# Patient Record
Sex: Female | Born: 1985 | Race: White | Hispanic: No | Marital: Married | Smoking: Never smoker
Health system: Southern US, Community
[De-identification: ages and names within clinical notes are randomized; demographics above are authoritative.]

## PROBLEM LIST (undated history)

## (undated) ENCOUNTER — Inpatient Hospital Stay (HOSPITAL_COMMUNITY): Payer: Self-pay

## (undated) DIAGNOSIS — Z8489 Family history of other specified conditions: Secondary | ICD-10-CM

## (undated) DIAGNOSIS — Z789 Other specified health status: Secondary | ICD-10-CM

## (undated) DIAGNOSIS — M549 Dorsalgia, unspecified: Secondary | ICD-10-CM

## (undated) DIAGNOSIS — M25559 Pain in unspecified hip: Secondary | ICD-10-CM

## (undated) HISTORY — DX: Dorsalgia, unspecified: M54.9

## (undated) HISTORY — PX: NO PAST SURGERIES: SHX2092

## (undated) HISTORY — DX: Family history of other specified conditions: Z84.89

## (undated) HISTORY — DX: Pain in unspecified hip: M25.559

---
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
## 2010-03-24 ENCOUNTER — Ambulatory Visit (HOSPITAL_COMMUNITY): Admission: RE | Admit: 2010-03-24 | Discharge: 2010-03-24 | Payer: Self-pay | Admitting: Obstetrics and Gynecology

## 2010-04-17 ENCOUNTER — Inpatient Hospital Stay (HOSPITAL_COMMUNITY): Admission: AD | Admit: 2010-04-17 | Discharge: 2010-04-17 | Payer: Self-pay | Admitting: Obstetrics & Gynecology

## 2010-07-25 ENCOUNTER — Ambulatory Visit: Payer: Self-pay | Admitting: Family

## 2010-07-25 ENCOUNTER — Inpatient Hospital Stay (HOSPITAL_COMMUNITY): Admission: AD | Admit: 2010-07-25 | Discharge: 2010-07-25 | Payer: Self-pay | Admitting: Obstetrics and Gynecology

## 2010-07-29 ENCOUNTER — Inpatient Hospital Stay (HOSPITAL_COMMUNITY): Admission: AD | Admit: 2010-07-29 | Discharge: 2010-07-29 | Payer: Self-pay | Admitting: Obstetrics and Gynecology

## 2010-08-01 ENCOUNTER — Inpatient Hospital Stay (HOSPITAL_COMMUNITY): Admission: AD | Admit: 2010-08-01 | Discharge: 2010-08-01 | Payer: Self-pay | Admitting: Obstetrics & Gynecology

## 2010-08-05 ENCOUNTER — Inpatient Hospital Stay (HOSPITAL_COMMUNITY): Admission: AD | Admit: 2010-08-05 | Discharge: 2010-08-08 | Payer: Self-pay | Admitting: Obstetrics and Gynecology

## 2010-08-05 ENCOUNTER — Ambulatory Visit: Payer: Self-pay | Admitting: Nurse Practitioner

## 2011-02-10 LAB — CBC
HCT: 27.6 % — ABNORMAL LOW (ref 36.0–46.0)
HCT: 33.3 % — ABNORMAL LOW (ref 36.0–46.0)
Hemoglobin: 11.3 g/dL — ABNORMAL LOW (ref 12.0–15.0)
Hemoglobin: 9.4 g/dL — ABNORMAL LOW (ref 12.0–15.0)
MCH: 28.8 pg (ref 26.0–34.0)
MCH: 29.2 pg (ref 26.0–34.0)
MCHC: 34 g/dL (ref 30.0–36.0)
RBC: 3.21 MIL/uL — ABNORMAL LOW (ref 3.87–5.11)
RBC: 3.92 MIL/uL (ref 3.87–5.11)
RDW: 12.9 % (ref 11.5–15.5)
RDW: 13 % (ref 11.5–15.5)
WBC: 11.1 10*3/uL — ABNORMAL HIGH (ref 4.0–10.5)

## 2011-02-14 LAB — URINALYSIS, ROUTINE W REFLEX MICROSCOPIC
Bilirubin Urine: NEGATIVE
Hgb urine dipstick: NEGATIVE
Ketones, ur: NEGATIVE mg/dL
Nitrite: NEGATIVE
pH: 7 (ref 5.0–8.0)

## 2012-04-17 LAB — OB RESULTS CONSOLE HIV ANTIBODY (ROUTINE TESTING): HIV: NONREACTIVE

## 2012-04-17 LAB — OB RESULTS CONSOLE ABO/RH: RH Type: POSITIVE

## 2012-04-17 LAB — OB RESULTS CONSOLE RUBELLA ANTIBODY, IGM: Rubella: IMMUNE

## 2012-04-17 LAB — OB RESULTS CONSOLE GC/CHLAMYDIA: Chlamydia: NEGATIVE

## 2012-04-17 LAB — OB RESULTS CONSOLE RPR: RPR: NONREACTIVE

## 2012-11-03 ENCOUNTER — Encounter (HOSPITAL_COMMUNITY): Payer: Self-pay

## 2012-11-03 ENCOUNTER — Inpatient Hospital Stay (HOSPITAL_COMMUNITY)
Admission: AD | Admit: 2012-11-03 | Discharge: 2012-11-03 | Disposition: A | Discharge status: Home / Self Care | Payer: BC Managed Care – PPO | Source: Ambulatory Visit | Attending: Obstetrics and Gynecology | Admitting: Obstetrics and Gynecology

## 2012-11-03 DIAGNOSIS — O479 False labor, unspecified: Secondary | ICD-10-CM

## 2012-11-03 DIAGNOSIS — O4700 False labor before 37 completed weeks of gestation, unspecified trimester: Secondary | ICD-10-CM | POA: Insufficient documentation

## 2012-11-03 DIAGNOSIS — M549 Dorsalgia, unspecified: Secondary | ICD-10-CM | POA: Insufficient documentation

## 2012-11-03 DIAGNOSIS — O99891 Other specified diseases and conditions complicating pregnancy: Secondary | ICD-10-CM | POA: Insufficient documentation

## 2012-11-03 HISTORY — DX: Other specified health status: Z78.9

## 2012-11-03 LAB — URINALYSIS, ROUTINE W REFLEX MICROSCOPIC
Glucose, UA: NEGATIVE mg/dL
Ketones, ur: NEGATIVE mg/dL
Protein, ur: NEGATIVE mg/dL
Specific Gravity, Urine: <1.005 — ABNORMAL LOW (ref 1.005–1.030)
pH: 6 (ref 5.0–8.0)

## 2012-11-03 LAB — URINE MICROSCOPIC-ADD ON

## 2012-11-03 NOTE — MAU Provider Note (Signed)
History     CSN: [id number]  Arrival date and time: 11/03/12 4098   First Provider Initiated Contact with Patient 11/03/12 0226      Chief Complaint  Patient presents with  . Contractions   HPI Patricia Everett 26 y.o. [redacted]w[redacted]d Comes to MAU with contractions and back pain since 2 pm on Friday.  Did not call the office today.  Having pain with urination.  Denies any leaking or vaginal bleeding.  In the office this week, baby was measuring small and an ultrasound was scheduled at her next visit on Thursday.  Very concerned about the baby.  Has had a long standing history of back pain in this pregnancy - sees an orthopedist and takes narcotics for back pain.  Was using a wheelchair at home for 3 weeks during the pregnancy due to the back pain.  Stopped taking the narcotics on Tuesday thinking that maybe her use of medication has contributed to the baby being small.    OB History    Grav Para Term Preterm Abortions TAB SAB Ect Mult Living   2 1 1       1       Past Medical History  Diagnosis Date  . No pertinent past medical history     Past Surgical History  Procedure Date  . No past surgeries     Family History  Problem Relation Age of Onset  . Other Neg Hx     History  Substance Use Topics  . Smoking status: Never Smoker   . Smokeless tobacco: Not on file  . Alcohol Use: No    Allergies: No Known Allergies  Prescriptions prior to admission  Medication Sig Dispense Refill  . oxyCODONE-acetaminophen (PERCOCET/ROXICET) 5-325 MG per tablet Take 1 tablet by mouth every 4 (four) hours as needed.      . Prenatal Vit-Fe Fumarate-FA (PRENATAL MULTIVITAMIN) TABS Take 1 tablet by mouth daily.        Review of Systems  Constitutional: Negative for fever.  Gastrointestinal: Positive for abdominal pain. Negative for nausea and vomiting.       Upper abdominal pain  Genitourinary: Positive for dysuria.       No vaginal discharge. No vaginal bleeding.  Musculoskeletal: Positive  for back pain.   Physical Exam   Blood pressure 115/82, pulse 81, resp. rate 20, height 5\' 5"  (1.651 m), weight 63.322 kg (139 lb 9.6 oz).  Initially, on arrival to MAU, BP was elevated 140/104, but as client rested in bed, BP returned to normal.  Physical Exam  Nursing note and vitals reviewed. Constitutional: She is oriented to person, place, and time. She appears well-developed and well-nourished.       Seems hot.  Sighs repetitively.  Fluffs gown to get air.  HENT:  Head: Normocephalic.  Eyes: EOM are normal.  Neck: Neck supple.  GI: Soft. There is tenderness. There is no rebound and no guarding.       Tender across the fundus. Fetal monitor - baby is reactive. Movement easily palpated by NP. Has uterine irritability and an occasional contraction.  Genitourinary:       Speculum exam.   Vulva - negative No blood seen.  No leaking.  No pooling. Moderate amount of creamy vaginal discharge. Bimanual - cervix is posterior, ??closed vs 1 cm Vertex is low at -1 station.  Musculoskeletal: Normal range of motion.  Neurological: She is alert and oriented to person, place, and time.  Skin: Skin is warm and dry.  Psychiatric: She has a normal mood and affect.    MAU Course  Procedures Results for orders placed during the hospital encounter of 11/03/12 (from the past 24 hour(s))  URINALYSIS, ROUTINE W REFLEX MICROSCOPIC     Status: Abnormal   Collection Time   11/03/12  1:41 AM      Component Value Range   Color, Urine YELLOW  YELLOW   APPearance CLEAR  CLEAR   Specific Gravity, Urine <1.005 (*) 1.005 - 1.030   pH 6.0  5.0 - 8.0   Glucose, UA NEGATIVE  NEGATIVE mg/dL   Hgb urine dipstick NEGATIVE  NEGATIVE   Bilirubin Urine NEGATIVE  NEGATIVE   Ketones, ur NEGATIVE  NEGATIVE mg/dL   Protein, ur NEGATIVE  NEGATIVE mg/dL   Urobilinogen, UA 0.2  0.0 - 1.0 mg/dL   Nitrite NEGATIVE  NEGATIVE   Leukocytes, UA SMALL (*) NEGATIVE  URINE MICROSCOPIC-ADD ON     Status: Normal    Collection Time   11/03/12  1:41 AM      Component Value Range   Squamous Epithelial / LPF RARE  RARE   WBC, UA 0-2  <3 WBC/hpf   RBC / HPF 0-2  <3 RBC/hpf   MDM 1500 Consult with Dr. Tenny Craw - discussed client's symptoms, physical exam and cervical exam.  Reviewed the plan of care - client to go home.    Assessment and Plan  Threatened preterm labor Back pain in pregnancy  Plan Pain and pressure with urination are probably happening as the baby is low in the pelvis. No ultrasound is needed tonight.  Your baby looks very good on the monitor. At this time, you are not in labor. If your contractions worsen, you will need to call your doctor. Return if you have vaginal bleeding or leaking of fluid. Spotting may be normal due to the cervical exam you have had tonight.    Patricia Everett 11/03/2012, 2:51 AM

## 2012-11-03 NOTE — MAU Note (Addendum)
Constant back pain since Friday at 2pm. Feels some contractions that she hasn't been able to time. Denies leaking of fluid or vaginal bleeding. Painful urination since Friday afternoon. Was told baby was measuring small in office on Tuesday; scheduled for an ultrasound next week.

## 2012-11-28 NOTE — L&D Delivery Note (Signed)
Pt was admitted with SROM. She had an epidural. She progressed along a normal labor curve without difficulty. She had a SVD of one live viable white female infant in the ROA position over 1st degree midline tear. Nuchal cord x1. Placenta - S/I appeared wnl.  Tear closed with 3-0 Chromic. Baby to NBN. EBL-400cc.

## 2012-11-29 ENCOUNTER — Inpatient Hospital Stay (HOSPITAL_COMMUNITY)
Admission: AD | Admit: 2012-11-29 | Discharge: 2012-11-29 | Disposition: A | Discharge status: Home / Self Care | Payer: BC Managed Care – PPO | Source: Ambulatory Visit | Attending: Obstetrics and Gynecology | Admitting: Obstetrics and Gynecology

## 2012-11-29 ENCOUNTER — Encounter (HOSPITAL_COMMUNITY): Payer: Self-pay | Admitting: *Deleted

## 2012-11-29 DIAGNOSIS — O99891 Other specified diseases and conditions complicating pregnancy: Secondary | ICD-10-CM | POA: Insufficient documentation

## 2012-11-29 DIAGNOSIS — O479 False labor, unspecified: Secondary | ICD-10-CM

## 2012-11-29 DIAGNOSIS — Z0000 Encounter for general adult medical examination without abnormal findings: Secondary | ICD-10-CM

## 2012-11-29 LAB — POCT FERN TEST: POCT Fern Test: NEGATIVE

## 2012-11-29 NOTE — ED Provider Notes (Signed)
None     Chief Complaint:  Rupture of Membranes   Patricia Everett is  27 y.o. G2P1001 at [redacted]w[redacted]d presents complaining of Rupture of Membranes .  She states none contractions are associated with none vaginal bleeding, possible ruptured membranes, along with active fetal movement. Pt took a bath, got out, and a few minutes later, and had some water come out of her vagina.  No leaking since. Obstetrical/Gynecological History: Menstrual History: OB History    Grav Para Term Preterm Abortions TAB SAB Ect Mult Living   2 1 1       1        No LMP recorded. Patient is pregnant.     Past Medical History: Past Medical History  Diagnosis Date  . No pertinent past medical history     Past Surgical History: Past Surgical History  Procedure Date  . No past surgeries     Family History: Family History  Problem Relation Age of Onset  . Other Neg Hx     Social History: History  Substance Use Topics  . Smoking status: Never Smoker   . Smokeless tobacco: Not on file  . Alcohol Use: No    Allergies: No Known Allergies  Meds:  Prescriptions prior to admission  Medication Sig Dispense Refill  . oxyCODONE-acetaminophen (PERCOCET/ROXICET) 5-325 MG per tablet Take 1 tablet by mouth every 4 (four) hours as needed.      . Prenatal Vit-Fe Fumarate-FA (PRENATAL MULTIVITAMIN) TABS Take 1 tablet by mouth daily.        Review of Systems - Please refer to the aforementioned patients' reports.     Physical Exam  Blood pressure 139/73, pulse 112, temperature 97.6 F (36.4 C), temperature source Oral, resp. rate 18, height 5\' 5"  (1.651 m), weight 64.683 kg (142 lb 9.6 oz). GENERAL: Well-developed, well-nourished female in no acute distress.  LUNGS: Clear to auscultation bilaterally.  HEART: Regular rate and rhythm. ABDOMEN: Soft, nontender, nondistended, gravid.  EXTREMITIES: Nontender, no edema, 2+ distal pulses. CERVICAL EXAM: SSE:  No pooling, normal appearing discharge; negative  valsalva, fern Dilatation 3cm   Effacement 60%   Station -2 (no change from earlier exam)   Presentation: cephalic FHT:  Baseline rate 140 bpm   Variability moderate  Accelerations present   Decelerations none Contractions:  Rare and not felt by pt   Labs: Fern negative Imaging Studies:  No results found.  Assessment: Patricia Everett is  27 y.o. G2P1001 at [redacted]w[redacted]d presents with no evidence of ROM.  Plan:  Discussed with Dr. Claiborne Billings.  D/C home  CRESENZO-DISHMAN,Brayn Eckstein 1/2/201410:40 PM

## 2012-11-29 NOTE — MAU Note (Signed)
Pt G2 P1 at 38.3wks, leaking clear fluid at 1950, having contractions every 3-49min.  12/30 SVE 3/70%.

## 2012-11-30 ENCOUNTER — Encounter (HOSPITAL_COMMUNITY): Payer: Self-pay | Admitting: *Deleted

## 2012-11-30 ENCOUNTER — Inpatient Hospital Stay (HOSPITAL_COMMUNITY)
Admission: AD | Admit: 2012-11-30 | Discharge: 2012-11-30 | Disposition: A | Discharge status: Home / Self Care | Payer: BC Managed Care – PPO | Source: Ambulatory Visit | Attending: Obstetrics and Gynecology | Admitting: Obstetrics and Gynecology

## 2012-11-30 DIAGNOSIS — O4690 Antepartum hemorrhage, unspecified, unspecified trimester: Secondary | ICD-10-CM | POA: Insufficient documentation

## 2012-11-30 DIAGNOSIS — Z3689 Encounter for other specified antenatal screening: Secondary | ICD-10-CM

## 2012-11-30 NOTE — MAU Note (Signed)
Seen in MAU last night, has been bleeding since cervical check.  Crampy, uncomfortable, back pain.  Denies LOF.

## 2012-11-30 NOTE — MAU Note (Signed)
Was here last night for contractions, is still contracting but came in because of the bleeding. No leaking.

## 2012-11-30 NOTE — MAU Provider Note (Signed)
Chief Complaint:  Vaginal Bleeding   First Provider Initiated Contact with Patient 11/30/12 1434      HPI: Patricia Everett is a 27 y.o. G2P1001 at [redacted]w[redacted]d who presents to maternity admissions reporting vaginal bleeding. The patient was evaluated in MAU last night for contractions. These have mostly subsided. She is only having occasional contractions today. She has had bleeding since her cervix was checked last night. She states that she sees blood and mucus when she wipes and is wearing a panty liner. She states that there have been some dime sized clots, but mostly smaller than that. She has also had mild cramping since last night. No LOF. She has had some minor nausea without vomiting. No fevers. Good fetal movement.   Past Medical History: Past Medical History  Diagnosis Date  . No pertinent past medical history     Past obstetric history: OB History    Grav Para Term Preterm Abortions TAB SAB Ect Mult Living   2 1 1       1      # Outc Date GA Lbr Len/2nd Wgt Sex Del Anes PTL Lv   1 TRM     F SVD   Yes   2 CUR               Past Surgical History: Past Surgical History  Procedure Date  . No past surgeries     Family History: Family History  Problem Relation Age of Onset  . Other Neg Hx     Social History: History  Substance Use Topics  . Smoking status: Never Smoker   . Smokeless tobacco: Not on file  . Alcohol Use: No    Allergies: No Known Allergies  Meds:  Prescriptions prior to admission  Medication Sig Dispense Refill  . oxyCODONE-acetaminophen (PERCOCET/ROXICET) 5-325 MG per tablet Take 1 tablet by mouth every 4 (four) hours as needed. For pain      . Prenatal Vit-Fe Fumarate-FA (PRENATAL MULTIVITAMIN) TABS Take 1 tablet by mouth daily.        ROS: Pertinent findings in history of present illness.  Physical Exam  Blood pressure 124/97, pulse 95, temperature 97.6 F (36.4 C), temperature source Oral, resp. rate 18. GENERAL: Well-developed,  well-nourished female in no acute distress.  HEENT: normocephalic HEART: normal rate and rhythm RESP: normal effort, clear to auscultation ABDOMEN: Soft, mild diffuse tenderness to palpation, gravid appropriate for gestational age EXTREMITIES: Nontender, no edema NEURO: alert and oriented SPECULUM EXAM: Mucus discharge, small amount blood, cervix friable Dilation: 3 Effacement (%): 60 Cervical Position: Middle Station: -2 Presentation: Vertex Exam by:: Naaman Plummer PA  FHT:  Baseline 130 , moderate variability, accelerations present, no decelerations Contractions: none   Labs: Results for orders placed during the hospital encounter of 11/29/12 (from the past 24 hour(s))  POCT FERN TEST     Status: Normal   Collection Time   11/29/12 10:50 PM      Component Value Range   POCT Fern Test Negative = intact amniotic membranes      Imaging:  No results found.  MAU Course: Patient on monitor x 30 minutes. Reactive NST. No contractions noted.  Assessment: 1. NST (non-stress test) reactive     Plan: Discharge home Bleeding precautions discussed Labor precautions discussed Patient to follow-up with Dr. Henderson Cloud as scheduled Patient may return to MAU as needed      Follow-up Information    Follow up with HORVATH,MICHELLE A, MD. (As scheduled)  Contact information:   [address] [place] [address] [phone]       Follow up with [facility] [place] [facility]. (As needed if symptoms worsen)    Contact information:   [address] [place] [address] [phone]          Medication List     As of 11/30/2012  3:01 PM    TAKE these medications         oxyCODONE-acetaminophen 5-325 MG per tablet   Commonly known as: PERCOCET/ROXICET   Take 1 tablet by mouth every 4 (four) hours as needed. For pain      prenatal multivitamin Tabs   Take 1 tablet by mouth daily.         Freddi Starr,  PA-C 11/30/2012 3:01 PM

## 2012-12-05 ENCOUNTER — Encounter (HOSPITAL_COMMUNITY): Payer: Self-pay | Admitting: *Deleted

## 2012-12-05 ENCOUNTER — Encounter (HOSPITAL_COMMUNITY): Payer: Self-pay

## 2012-12-05 ENCOUNTER — Encounter (HOSPITAL_COMMUNITY): Payer: Self-pay | Admitting: Anesthesiology

## 2012-12-05 ENCOUNTER — Inpatient Hospital Stay (HOSPITAL_COMMUNITY): Payer: BC Managed Care – PPO | Admitting: Anesthesiology

## 2012-12-05 ENCOUNTER — Inpatient Hospital Stay (HOSPITAL_COMMUNITY)
Admission: AD | Admit: 2012-12-05 | Discharge: 2012-12-06 | DRG: 373 | Disposition: A | Discharge status: Home / Self Care | Payer: BC Managed Care – PPO | Source: Ambulatory Visit | Attending: Obstetrics & Gynecology | Admitting: Obstetrics & Gynecology

## 2012-12-05 LAB — CBC
Hemoglobin: 11.3 g/dL — ABNORMAL LOW (ref 12.0–15.0)
MCH: 27.8 pg (ref 26.0–34.0)
MCHC: 32.9 g/dL (ref 30.0–36.0)
MCV: 84.5 fL (ref 78.0–100.0)
RBC: 4.06 MIL/uL (ref 3.87–5.11)

## 2012-12-05 LAB — RPR: RPR Ser Ql: NONREACTIVE

## 2012-12-05 MED ORDER — ONDANSETRON HCL 4 MG PO TABS: 4.0000 mg | ORAL_TABLET | ORAL | Status: DC | PRN

## 2012-12-05 MED ORDER — LIDOCAINE HCL (PF) 1 % IJ SOLN
30.0000 mL | INTRAMUSCULAR | Status: DC | PRN
  Filled 2012-12-05: qty 30

## 2012-12-05 MED ORDER — OXYCODONE-ACETAMINOPHEN 5-325 MG PO TABS: 1.0000 | ORAL_TABLET | ORAL | Status: DC | PRN

## 2012-12-05 MED ORDER — OXYTOCIN 40 UNITS IN LACTATED RINGERS INFUSION - SIMPLE MED
62.5000 mL/h | INTRAVENOUS | Status: DC
  Filled 2012-12-05: qty 1000

## 2012-12-05 MED ORDER — FENTANYL 2.5 MCG/ML BUPIVACAINE 1/10 % EPIDURAL INFUSION (WH - ANES)
14.0000 mL/h | INTRAMUSCULAR | Status: DC
  Filled 2012-12-05: qty 125

## 2012-12-05 MED ORDER — DIBUCAINE 1 % RE OINT: 1.0000 "application " | TOPICAL_OINTMENT | RECTAL | Status: DC | PRN

## 2012-12-05 MED ORDER — LACTATED RINGERS IV SOLN: 500.0000 mL | Freq: Once | INTRAVENOUS | Status: DC

## 2012-12-05 MED ORDER — IBUPROFEN 600 MG PO TABS
600.0000 mg | ORAL_TABLET | Freq: Four times a day (QID) | ORAL | Status: DC
  Administered 2012-12-05 – 2012-12-06 (×4): 600 mg via ORAL
  Filled 2012-12-05 (×4): qty 1

## 2012-12-05 MED ORDER — METHYLERGONOVINE MALEATE 0.2 MG/ML IJ SOLN
INTRAMUSCULAR | Status: AC
  Filled 2012-12-05: qty 1

## 2012-12-05 MED ORDER — TETANUS-DIPHTH-ACELL PERTUSSIS 5-2.5-18.5 LF-MCG/0.5 IM SUSP
0.5000 mL | Freq: Once | INTRAMUSCULAR | Status: AC
  Administered 2012-12-06: 0.5 mL via INTRAMUSCULAR
  Filled 2012-12-05: qty 0.5

## 2012-12-05 MED ORDER — DIPHENHYDRAMINE HCL 50 MG/ML IJ SOLN: 12.5000 mg | INTRAMUSCULAR | Status: DC | PRN

## 2012-12-05 MED ORDER — EPHEDRINE 5 MG/ML INJ: 10.0000 mg | INTRAVENOUS | Status: DC | PRN

## 2012-12-05 MED ORDER — ONDANSETRON HCL 4 MG/2ML IJ SOLN: 4.0000 mg | INTRAMUSCULAR | Status: DC | PRN

## 2012-12-05 MED ORDER — MEASLES, MUMPS & RUBELLA VAC ~~LOC~~ INJ
0.5000 mL | INJECTION | Freq: Once | SUBCUTANEOUS | Status: DC
Start: 2012-12-06 — End: 2012-12-06
  Filled 2012-12-05: qty 0.5

## 2012-12-05 MED ORDER — LIDOCAINE HCL (PF) 1 % IJ SOLN
INTRAMUSCULAR | Status: DC | PRN
  Administered 2012-12-05 (×2): 9 mL

## 2012-12-05 MED ORDER — ZOLPIDEM TARTRATE 5 MG PO TABS: 5.0000 mg | ORAL_TABLET | Freq: Every evening | ORAL | Status: DC | PRN

## 2012-12-05 MED ORDER — BENZOCAINE-MENTHOL 20-0.5 % EX AERO
1.0000 "application " | INHALATION_SPRAY | CUTANEOUS | Status: DC | PRN
  Administered 2012-12-05: 1 via TOPICAL
  Filled 2012-12-05: qty 56

## 2012-12-05 MED ORDER — PHENYLEPHRINE 40 MCG/ML (10ML) SYRINGE FOR IV PUSH (FOR BLOOD PRESSURE SUPPORT)
80.0000 ug | PREFILLED_SYRINGE | INTRAVENOUS | Status: DC | PRN
  Filled 2012-12-05: qty 5

## 2012-12-05 MED ORDER — ONDANSETRON HCL 4 MG/2ML IJ SOLN: 4.0000 mg | Freq: Four times a day (QID) | INTRAMUSCULAR | Status: DC | PRN

## 2012-12-05 MED ORDER — WITCH HAZEL-GLYCERIN EX PADS: 1.0000 "application " | MEDICATED_PAD | CUTANEOUS | Status: DC | PRN

## 2012-12-05 MED ORDER — CITRIC ACID-SODIUM CITRATE 334-500 MG/5ML PO SOLN: 30.0000 mL | ORAL | Status: DC | PRN

## 2012-12-05 MED ORDER — OXYCODONE-ACETAMINOPHEN 5-325 MG PO TABS
1.0000 | ORAL_TABLET | ORAL | Status: DC | PRN
  Administered 2012-12-05 – 2012-12-06 (×4): 1 via ORAL
  Filled 2012-12-05 (×4): qty 1

## 2012-12-05 MED ORDER — SIMETHICONE 80 MG PO CHEW: 80.0000 mg | CHEWABLE_TABLET | ORAL | Status: DC | PRN

## 2012-12-05 MED ORDER — LACTATED RINGERS IV SOLN: 500.0000 mL | INTRAVENOUS | Status: DC | PRN

## 2012-12-05 MED ORDER — PHENYLEPHRINE 40 MCG/ML (10ML) SYRINGE FOR IV PUSH (FOR BLOOD PRESSURE SUPPORT): 80.0000 ug | PREFILLED_SYRINGE | INTRAVENOUS | Status: DC | PRN

## 2012-12-05 MED ORDER — BUTORPHANOL TARTRATE 1 MG/ML IJ SOLN
1.0000 mg | INTRAMUSCULAR | Status: DC | PRN
Start: 2012-12-05 — End: 2012-12-05

## 2012-12-05 MED ORDER — LACTATED RINGERS IV SOLN
INTRAVENOUS | Status: DC
  Administered 2012-12-05 (×2): via INTRAVENOUS

## 2012-12-05 MED ORDER — OXYTOCIN BOLUS FROM INFUSION
500.0000 mL | INTRAVENOUS | Status: DC
  Administered 2012-12-05: 500 mL via INTRAVENOUS

## 2012-12-05 MED ORDER — EPHEDRINE 5 MG/ML INJ
10.0000 mg | INTRAVENOUS | Status: AC | PRN
  Administered 2012-12-05 (×2): 10 mg via INTRAVENOUS
  Filled 2012-12-05: qty 4

## 2012-12-05 MED ORDER — FENTANYL 2.5 MCG/ML BUPIVACAINE 1/10 % EPIDURAL INFUSION (WH - ANES)
INTRAMUSCULAR | Status: DC | PRN
  Administered 2012-12-05: 14 mL/h via EPIDURAL

## 2012-12-05 MED ORDER — ACETAMINOPHEN 325 MG PO TABS: 650.0000 mg | ORAL_TABLET | ORAL | Status: DC | PRN

## 2012-12-05 MED ORDER — IBUPROFEN 600 MG PO TABS: 600.0000 mg | ORAL_TABLET | Freq: Four times a day (QID) | ORAL | Status: DC | PRN

## 2012-12-05 NOTE — H&P (Signed)
27 y.o. G2P1001  Estimated Date of Delivery: 12/10/12 admitted at 39/[redacted] weeks gestation in labor.  Prenatal Transfer Tool  Maternal Diabetes: No Genetic Screening: Normal Maternal Ultrasounds/Referrals: Normal Fetal Ultrasounds or other Referrals:  None Maternal Substance Abuse:  No Significant Maternal Medications:  None Significant Maternal Lab Results: None Other Significant Pregnancy Complications:  Back pain with pregnancy  Afebrile, VSS Heart and Lungs: No active disease Abdomen: soft, gravid, EFW AGA. Cervical exam:  5/70.  Pooling but no fern.  Bloody show.  Impression: Probable SROM with early labor at term  Plan:  Admit.  Expect vaginal delivery.  Augment with pitocin if necessary.

## 2012-12-05 NOTE — Anesthesia Preprocedure Evaluation (Signed)
Anesthesia Evaluation  Patient identified by MRN, date of birth, ID band Patient awake    Reviewed: Allergy & Precautions, H&P , NPO status , Patient's Chart, lab work & pertinent test results  Airway Mallampati: I TM Distance: >3 FB Neck ROM: full    Dental No notable dental hx. (+) Teeth Intact   Pulmonary neg pulmonary ROS,    Pulmonary exam normal       Cardiovascular negative cardio ROS  Rhythm:regular     Neuro/Psych negative neurological ROS  negative psych ROS   GI/Hepatic negative GI ROS, Neg liver ROS,   Endo/Other  negative endocrine ROS  Renal/GU negative Renal ROS  negative genitourinary   Musculoskeletal negative musculoskeletal ROS (+)   Abdominal Normal abdominal exam  (+)   Peds negative pediatric ROS (+)  Hematology negative hematology ROS (+)   Anesthesia Other Findings   Reproductive/Obstetrics negative OB ROS (+) Pregnancy                           Anesthesia Physical Anesthesia Plan  ASA: II  Anesthesia Plan: Epidural   Post-op Pain Management:    Induction:   Airway Management Planned:   Additional Equipment:   Intra-op Plan:   Post-operative Plan:   Informed Consent: I have reviewed the patients History and Physical, chart, labs and discussed the procedure including the risks, benefits and alternatives for the proposed anesthesia with the patient or authorized representative who has indicated his/her understanding and acceptance.     Plan Discussed with:   Anesthesia Plan Comments:         Anesthesia Quick Evaluation

## 2012-12-05 NOTE — MAU Note (Signed)
Pt states she was laying in bed when she states she felt a "pop" States she got up and felt a gush of water. States contractions are every 4-5 minutes.

## 2012-12-05 NOTE — Anesthesia Procedure Notes (Signed)
Epidural Patient location during procedure: OB Start time: 12/05/2012 3:40 AM End time: 12/05/2012 3:45 AM  Staffing Anesthesiologist: Sandrea Hughs Performed by: anesthesiologist   Preanesthetic Checklist Completed: patient identified, site marked, surgical consent, pre-op evaluation, timeout performed, IV checked, risks and benefits discussed and monitors and equipment checked  Epidural Patient position: sitting Prep: site prepped and draped and DuraPrep Patient monitoring: continuous pulse ox and blood pressure Approach: midline Injection technique: LOR air  Needle:  Needle type: Tuohy  Needle gauge: 17 G Needle length: 9 cm and 9 Needle insertion depth: 5 cm cm Catheter type: closed end flexible Catheter size: 19 Gauge Catheter at skin depth: 10 cm Test dose: negative and Other  Assessment Sensory level: T8 Events: blood not aspirated, injection not painful, no injection resistance, negative IV test and no paresthesia  Additional Notes Reason for block:procedure for pain

## 2012-12-06 LAB — CBC
HCT: 28.7 % — ABNORMAL LOW (ref 36.0–46.0)
Hemoglobin: 9.4 g/dL — ABNORMAL LOW (ref 12.0–15.0)
MCH: 28.2 pg (ref 26.0–34.0)
MCHC: 32.8 g/dL (ref 30.0–36.0)
MCV: 86.2 fL (ref 78.0–100.0)

## 2012-12-06 MED ORDER — OXYCODONE-ACETAMINOPHEN 5-325 MG PO TABS: 1.0000 | ORAL_TABLET | ORAL | Status: DC | PRN

## 2012-12-06 MED ORDER — IBUPROFEN 600 MG PO TABS: 600.0000 mg | ORAL_TABLET | Freq: Four times a day (QID) | ORAL | Status: DC

## 2012-12-06 NOTE — Anesthesia Postprocedure Evaluation (Signed)
  Anesthesia Post-op Note  Patient: Patricia Everett  Procedure(s) Performed: * No procedures listed *  Patient Location: Mother/Baby  Anesthesia Type:Epidural  Level of Consciousness: awake, alert  and oriented  Airway and Oxygen Therapy: Patient Spontanous Breathing  Post-op Pain: none  Post-op Assessment: Post-op Vital signs reviewed, Patient's Cardiovascular Status Stable, No headache, No backache, No residual numbness and No residual motor weakness  Post-op Vital Signs: Reviewed and stable  Complications: No apparent anesthesia complications

## 2012-12-06 NOTE — Discharge Summary (Signed)
Obstetric Discharge Summary Reason for Admission: onset of labor and rupture of membranes Prenatal Procedures: none Intrapartum Procedures: spontaneous vaginal delivery Postpartum Procedures: none Complications-Operative and Postpartum: 1st degree perineal laceration Hemoglobin  Date Value Range Status  12/06/2012 9.4* 12.0 - 15.0 g/dL Final     DELTA CHECK NOTED     REPEATED TO VERIFY     HCT  Date Value Range Status  12/06/2012 28.7* 36.0 - 46.0 % Final    Physical Exam:  General: alert and cooperative Lochia: appropriate Uterine Fundus: firm DVT Evaluation: No evidence of DVT seen on physical exam.  Discharge Diagnoses: Term Pregnancy-delivered  Discharge Information: Date: 12/06/2012 Activity: pelvic rest Diet: routine Medications: PNV, Ibuprofen and Percocet, iron qd Condition: stable Instructions: refer to practice specific booklet Discharge to: home Follow-up Information    Follow up with Levi Aland, MD. In 4 weeks.   Contact information:   [address] [place] [address] [phone]          Newborn Data: Live born female  Birth Weight: 6 lb 12.1 oz (3065 g) APGAR: 8, 9  Home with mother.  Philip Aspen 12/06/2012, 9:56 AM

## 2012-12-06 NOTE — Addendum Note (Signed)
Addendum  created 12/06/12 1635 by Mamoru Takeshita M Kamyla Olejnik, CRNA   Modules edited:Charges VN, Notes Section    

## 2012-12-06 NOTE — Addendum Note (Signed)
Addendum  created 12/06/12 1635 by Shanon Payor, CRNA   Modules edited:Charges VN, Notes Section

## 2012-12-06 NOTE — Anesthesia Postprocedure Evaluation (Signed)
Anesthesia Post Note  Patient: Patricia Everett  Procedure(s) Performed: * No procedures listed *  Anesthesia type: Epidural  Patient location: Mother/Baby  Post pain: Pain level controlled  Post assessment: Post-op Vital signs reviewed  Last Vitals: There were no vitals filed for this visit.  Post vital signs: Reviewed  Level of consciousness: awake  Complications: No apparent anesthesia complications

## 2012-12-11 ENCOUNTER — Inpatient Hospital Stay (HOSPITAL_COMMUNITY): Admission: RE | Admit: 2012-12-11 | Payer: BC Managed Care – PPO | Source: Ambulatory Visit

## 2013-04-03 ENCOUNTER — Encounter: Payer: Self-pay | Admitting: Diagnostic Neuroimaging

## 2013-04-03 ENCOUNTER — Ambulatory Visit (INDEPENDENT_AMBULATORY_CARE_PROVIDER_SITE_OTHER): Payer: 59 | Admitting: Diagnostic Neuroimaging

## 2013-04-03 VITALS — BP 121/87 | HR 86 | Temp 98.2°F | Ht 65.0 in | Wt 130.0 lb

## 2013-04-03 DIAGNOSIS — R209 Unspecified disturbances of skin sensation: Secondary | ICD-10-CM

## 2013-04-03 DIAGNOSIS — R200 Anesthesia of skin: Secondary | ICD-10-CM

## 2013-04-03 DIAGNOSIS — M545 Low back pain: Secondary | ICD-10-CM

## 2013-04-03 NOTE — Patient Instructions (Addendum)
I will check EMG/NCS. Continue PT exercises at home.

## 2013-04-03 NOTE — Progress Notes (Signed)
[facility]  PATIENT: Patricia Everett DOB: November 26, 1986  REFERRING CLINICIAN: Kendall HISTORY FROM: patient and mother REASON FOR VISIT: new consult   HISTORICAL  CHIEF COMPLAINT:  Chief Complaint  Patient presents with  . Extremity Weakness    leg  . Back Pain    HISTORY OF PRESENT ILLNESS:   27 year old right-handed female here for evaluation of left leg numbness and weakness.  During her last pregnancy, when she was [redacted] weeks pregnant in August 2013, patient developed gradual onset of left leg numbness, pain, weakness. Symptoms progressed over 3 days to the point where she was unable to put weight on her left leg. Patient was treated conservatively at that time. Within 3-4 weeks of physical therapy and rest, she was gradually able to start walking again. Patient delivered her child in January 2014. Since that time she's continued to have evaluation for her low back pain, left leg numbness. She had MRI lumbar spine which is unremarkable. EMG/nerve conduction study showed possible left peroneal motor neuropathy.  Overall patient symptoms are gradually improving. She has some mild residual numbness in her left thigh region with some left lower back pain.  REVIEW OF SYSTEMS: Full 14 system review of systems performed and notable only for fatigue constipation tripping aching muscles nonacidic acidity headache numbness restless legs.  ALLERGIES: No Known Allergies  HOME MEDICATIONS: Outpatient Prescriptions Prior to Visit  Medication Sig Dispense Refill  . ibuprofen (ADVIL,MOTRIN) 600 MG tablet Take 1 tablet (600 mg total) by mouth every 6 (six) hours.  30 tablet  0  . oxyCODONE-acetaminophen (PERCOCET/ROXICET) 5-325 MG per tablet Take 1-2 tablets by mouth every 4 (four) hours as needed (moderate - severe pain).  20 tablet  0  . Prenatal Vit-Fe Fumarate-FA (PRENATAL MULTIVITAMIN) TABS Take 1 tablet by mouth daily.       No facility-administered medications  prior to visit.    PAST MEDICAL HISTORY: Past Medical History  Diagnosis Date  . No pertinent past medical history   . Pain in back   . Pain in hip     PAST SURGICAL HISTORY: Past Surgical History  Procedure Laterality Date  . No past surgeries      FAMILY HISTORY: Family History  Problem Relation Age of Onset  . Other Neg Hx   . Migraines Sister   . Heart disease Maternal Grandmother   . Heart disease Maternal Grandfather   . Neuropathy Paternal Grandmother   . Ovarian cancer Paternal Grandmother   . Ovarian cancer Paternal Grandfather     SOCIAL HISTORY:  History   Social History  . Marital Status: Married    Spouse Name: Zenora Karpel    Number of Children: 2  . Years of Education: college   Occupational History  .     Social History Main Topics  . Smoking status: Never Smoker   . Smokeless tobacco: Never Used  . Alcohol Use: No  . Drug Use: No  . Sexually Active: Yes    Birth Control/ Protection: None   Other Topics Concern  . Not on file   Social History Narrative   Pt lives at home with her spouse and children.   Caffeine Use: 6 or more cans daily.     PHYSICAL EXAM  Filed Vitals:   04/03/13 1011  BP: 121/87  Pulse: 86  Temp: 98.2 F (36.8 C)  TempSrc: Oral  Height: 5\' 5"  (1.651 m)  Weight: 130 lb (58.968 kg)   Body mass index is  21.63 kg/(m^2).  GENERAL EXAM: Patient is in no distress  CARDIOVASCULAR: Regular rate and rhythm, no murmurs, no carotid bruits  NEUROLOGIC: MENTAL STATUS: awake, alert, language fluent, comprehension intact, naming intact CRANIAL NERVE: no papilledema on fundoscopic exam, pupils equal and reactive to light, visual fields full to confrontation, extraocular muscles intact, no nystagmus, facial sensation and strength symmetric, uvula midline, shoulder shrug symmetric, tongue midline. MOTOR: normal bulk and tone, full strength in the BUE, BLE SENSORY: DECR PP, LT, TEMP IN LEFT LEG BELOW KNEE, LATERALLY, AND  ENTIRE FOOT. COORDINATION: finger-nose-finger, fine finger movements normal REFLEXES: deep tendon reflexes present and symmetric GAIT/STATION: narrow based gait; able to walk on toes, heels and tandem; romberg is negative   DIAGNOSTIC DATA (LABS, IMAGING, TESTING) - I reviewed patient records, labs, notes, testing and imaging myself where available.  Lab Results  Component Value Date   WBC 9.7 12/06/2012   HGB 9.4* 12/06/2012   HCT 28.7* 12/06/2012   MCV 86.2 12/06/2012   PLT 161 12/06/2012   No results found for this basename: na, k, cl, co2, glucose, bun, creatinine, calcium, prot, albumin, ast, alt, alkphos, bilitot, gfrnonaa, gfraa   No results found for this basename: CHOL, HDL, LDLCALC, LDLDIRECT, TRIG, CHOLHDL   No results found for this basename: HGBA1C   No results found for this basename: VITAMINB12   No results found for this basename: TSH    01/04/13 MRI lumbar spine - normal   ASSESSMENT AND PLAN  27 y.o. year old female  has a past medical history of No pertinent past medical history; Pain in back; and Pain in hip. here with low back pain, left lower extremity numbness and weakness. Symptoms have significantly improved, with mild residual pain and numbness. Physical exam shows decreased sensation in left peroneal sensory distribution. EMG/NCS by Dr. Ethelene Hal reviewed, and report mentions left peroneal motor nerve abnormality with decreased amplitude and slow conduction velocity across the fibular head (10 m/s). A left peroneal compressive neuropathy at the fibular head could explain the sensory findings I see in the patient today. However this would not explain the left lower back pain and left thigh numbness that the patient has.  I will repeat nerve conduction study and EMG, this time checking the bilateral lower extremities as well as 1 upper extremity to rule out a more widespread neuropathic process. Based on this, I will order additional lab testing.   Orders Placed This  Encounter  Procedures  . NCV with EMG(electromyography)    Suanne Marker, MD 04/03/2013, 10:59 AM Certified in Neurology, Neurophysiology and Neuroimaging  [facility] [address] [place], [place] [address] [phone]

## 2013-04-08 ENCOUNTER — Ambulatory Visit (INDEPENDENT_AMBULATORY_CARE_PROVIDER_SITE_OTHER): Payer: 59 | Admitting: Diagnostic Neuroimaging

## 2013-04-08 ENCOUNTER — Encounter (INDEPENDENT_AMBULATORY_CARE_PROVIDER_SITE_OTHER): Payer: 59 | Admitting: Radiology

## 2013-04-08 DIAGNOSIS — R209 Unspecified disturbances of skin sensation: Secondary | ICD-10-CM

## 2013-04-08 DIAGNOSIS — Z0289 Encounter for other administrative examinations: Secondary | ICD-10-CM

## 2013-04-08 DIAGNOSIS — R29898 Other symptoms and signs involving the musculoskeletal system: Secondary | ICD-10-CM

## 2013-04-08 DIAGNOSIS — R200 Anesthesia of skin: Secondary | ICD-10-CM

## 2013-04-08 DIAGNOSIS — M545 Low back pain: Secondary | ICD-10-CM

## 2013-04-19 NOTE — Procedures (Signed)
   [facility]  NCS (NERVE CONDUCTION STUDY) WITH EMG (ELECTROMYOGRAPHY) REPORT   STUDY DATE: 04/08/13 PATIENT NAME: Patricia Everett DOB: [birth year] MRN: [id number]  ORDERING CLINICIAN: Joycelyn Schmid, MD   TECHNOLOGIST: Kaylyn Lim ELECTROMYOGRAPHER: Glenford Bayley. Kallie Depolo, MD  CLINICAL INFORMATION: 27 year old female with left lower extremity numbness and weakness.  FINDINGS: NERVE CONDUCTION STUDY: Right median, right ulnar, right peroneal and bilateral tibial motor responses have normal distal latencies, amplitudes, conduction velocities and F-wave latencies. Left peroneal motor response has normal distal latency, decreased amplitude, normal conduction velocity and absent F-wave latency.  Right median, right ulnar, bilateral sural and bilateral superficial peroneal sensory responses have normal amplitudes and conduction velocities.   NEEDLE ELECTROMYOGRAPHY: Needle examination of selected muscles of the left lower extremity (vastus medialis, tibialis anterior, tibialis posterior, gastrocnemius, peroneus) shows no abnormal spontaneous activity at rest and decreased motor unit recruitment in the left tibialis anterior muscle. Needle examination of the left L4-5 and L5-S1 paraspinal muscles is normal.  IMPRESSION:  Abnormal study demonstrating: 1. Chronic left deep peroneal motor neuropathy. No active denervation on needle EMG. 2. No evidence of left L5 radiculopathy or more widespread large fiber neuropathy at this time.   INTERPRETING PHYSICIAN:  Suanne Marker, MD Certified in Neurology, Neurophysiology and Neuroimaging  [facility] [address], [address] [place], [place] [address] [phone]

## 2014-09-29 ENCOUNTER — Encounter: Payer: Self-pay | Admitting: Diagnostic Neuroimaging

## 2015-01-27 HISTORY — PX: POLYPECTOMY: SHX149

## 2015-04-09 ENCOUNTER — Ambulatory Visit (INDEPENDENT_AMBULATORY_CARE_PROVIDER_SITE_OTHER): Payer: PRIVATE HEALTH INSURANCE | Admitting: Diagnostic Neuroimaging

## 2015-04-09 ENCOUNTER — Encounter: Payer: Self-pay | Admitting: Diagnostic Neuroimaging

## 2015-04-09 VITALS — BP 122/85 | HR 94 | Ht 65.0 in | Wt 133.4 lb

## 2015-04-09 DIAGNOSIS — G4452 New daily persistent headache (NDPH): Secondary | ICD-10-CM | POA: Diagnosis not present

## 2015-04-09 DIAGNOSIS — G43001 Migraine without aura, not intractable, with status migrainosus: Secondary | ICD-10-CM | POA: Diagnosis not present

## 2015-04-09 MED ORDER — PREDNISONE 10 MG PO TABS
ORAL_TABLET | ORAL | Status: DC
Start: 2015-04-09 — End: 2015-07-07

## 2015-04-09 MED ORDER — DICLOFENAC POTASSIUM(MIGRAINE) 50 MG PO PACK: 50.0000 mg | PACK | ORAL | Status: AC | PRN

## 2015-04-09 MED ORDER — RIZATRIPTAN BENZOATE 10 MG PO TBDP: 10.0000 mg | ORAL_TABLET | ORAL | Status: AC | PRN

## 2015-04-09 NOTE — Progress Notes (Signed)
[facility]  PATIENT: Patricia Everett DOB: February 13, 1986  REFERRING CLINICIAN: Morris HISTORY FROM: patient  REASON FOR VISIT: new consult    HISTORICAL  CHIEF COMPLAINT:  Chief Complaint  Patient presents with  . Follow-up    HISTORY OF PRESENT ILLNESS:   29 year old female here for evaluation of headaches. March 2016 patient developed onset of occipital, posterior neck pain pressure squeezing sensation. Around this time patient had some endometrial polyps which were removed. Patient was prescribed sumatriptan by OB/GYN. Some of her headaches had photophobia, nausea, right hand numbness associated. April 12 1 week before her menstrual cycle, patient had a 15 hour migraine with nausea, severe pain. She took ibuprofen, sumatriptan, Toradol and Flexeril without significant relief. Patient had additional severe headaches on April 14, April 15, April 18-21st, April 27, April 28, April 30, May 3-4, May 6, May 7, May 9-11. Patient missed several days of work during this time. Patient tried Fioricet and Toradol. She had Toradol and Phenergan injection which seemed to help.  No specific triggering factors that have changed. She does drink 6 cans of [facility] every day, and has done this for several years. She has interrupted sleep for the past 2 years related to her 46-year-old daughter who wakes up twice per night. 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  No family history of migraine. She has a maternal uncle who had cluster headaches.   Patient has had kidney stones in the past.  I saw patient in 2014 for left foot numbness, diagnosed with left peroneal neuropathy. The symptoms are stable and do not bother the patient that much.   REVIEW OF SYSTEMS: Full 14 system review of systems performed and notable only for headache numbness decreased energy blurred vision fatigue.   ALLERGIES: Allergies  Allergen Reactions    . Aspirin     HOME MEDICATIONS: Outpatient Prescriptions Prior to Visit  Medication Sig Dispense Refill  . ibuprofen (ADVIL,MOTRIN) 200 MG tablet Take 200 mg by mouth every 6 (six) hours as needed for pain.    . traMADol (ULTRAM) 50 MG tablet Take 50 mg by mouth at bedtime.     No facility-administered medications prior to visit.    PAST MEDICAL HISTORY: Past Medical History  Diagnosis Date  . No pertinent past medical history   . Pain in back   . Pain in hip     PAST SURGICAL HISTORY: Past Surgical History  Procedure Laterality Date  . No past surgeries    . Polypectomy  01/2015    FAMILY HISTORY: Family History  Problem Relation Age of Onset  . Other Neg Hx   . Migraines Sister   . Heart disease Maternal Grandmother   . Heart disease Maternal Grandfather   . Neuropathy Paternal Grandmother   . Ovarian cancer Paternal Grandmother   . Ovarian cancer Paternal Grandfather     SOCIAL HISTORY:  History   Social History  . Marital Status: Married    Spouse Name: Piper Hassebrock  . Number of Children: 2  . Years of Education: college   Occupational History  .      [employer]   Social History Main Topics  . Smoking status: Never Smoker   . Smokeless tobacco: Never Used  . Alcohol Use: No  . Drug Use: No  . Sexual Activity: Yes    Birth Control/ Protection: None   Other Topics Concern  . Not on file  Social History Narrative   Pt lives at home with her spouse and children.   Caffeine Use: 6 or more cans daily.     PHYSICAL EXAM  Filed Vitals:   04/09/15 1443  BP: 122/85  Pulse: 94  Height: 5\' 5"  (1.651 m)  Weight: 133 lb 6.4 oz (60.51 kg)    Body mass index is 22.2 kg/(m^2).   Visual Acuity Screening   Right eye Left eye Both eyes  Without correction: 20/40 20/30   With correction:       No flowsheet data found.  GENERAL EXAM: Patient is in no distress; well developed, nourished and groomed; neck is  supple  CARDIOVASCULAR: Regular rate and rhythm, no murmurs, no carotid bruits  NEUROLOGIC: MENTAL STATUS: awake, alert, oriented to person, place and time, recent and remote memory intact, normal attention and concentration, language fluent, comprehension intact, naming intact, fund of knowledge appropriate CRANIAL NERVE: no papilledema on fundoscopic exam, pupils equal and reactive to light, visual fields full to confrontation, extraocular muscles intact, no nystagmus, facial sensation and strength symmetric, hearing intact, palate elevates symmetrically, uvula midline, shoulder shrug symmetric, tongue midline. MOTOR: normal bulk and tone, full strength in the BUE, BLE SENSORY: normal and symmetric to light touch, pinprick, temperature, vibration COORDINATION: finger-nose-finger, fine finger movements normal REFLEXES: deep tendon reflexes present and symmetric GAIT/STATION: narrow based gait; able to walk on toes, heels and tandem; romberg is negative    DIAGNOSTIC DATA (LABS, IMAGING, TESTING) - I reviewed patient records, labs, notes, testing and imaging myself where available.  Lab Results  Component Value Date   WBC 9.7 12/06/2012   HGB 9.4* 12/06/2012   HCT 28.7* 12/06/2012   MCV 86.2 12/06/2012   PLT 161 12/06/2012   No results found for: NA, K, CL, CO2, GLUCOSE, BUN, CREATININE, CALCIUM, PROT, ALBUMIN, AST, ALT, ALKPHOS, BILITOT, GFRNONAA, GFRAA No results found for: CHOL, HDL, LDLCALC, LDLDIRECT, TRIG, CHOLHDL No results found for: [id number] No results found for: VITAMINB12 No results found for: TSH     ASSESSMENT AND PLAN  29 y.o. year old female here with new onset headaches in March and April 2016. Headaches have migraine features. No previous similar headaches. Due to severity and duration of headaches, new onset characteristic, and associated focal neurologic symptoms (right hand numbness) I will check MRI of the brain to rule out secondary causes.  PLAN: -  Prednisone Dosepak - Rizatriptan as needed for breakthrough headache - Cambia as needed for breakthrough headache - MRI brain   Orders Placed This Encounter  Procedures  . MR Brain W Wo Contrast   Meds ordered this encounter  Medications  . DISCONTD: butalbital-acetaminophen-caffeine (FIORICET, ESGIC) 50-325-40 MG per tablet    Sig: Take 1 tablet by mouth every 4 (four) hours as needed for headache.  Marland Kitchen. DISCONTD: SUMAtriptan (IMITREX) 50 MG tablet    Sig: Take 50 mg by mouth every 2 (two) hours as needed for migraine. May repeat in 2 hours if headache persists or recurs.  . rizatriptan (MAXALT-MLT) 10 MG disintegrating tablet    Sig: Take 1 tablet (10 mg total) by mouth as needed for migraine. May repeat in 2 hours if needed    Dispense:  9 tablet    Refill:  11  . Diclofenac Potassium (CAMBIA) 50 MG PACK    Sig: Take 50 mg by mouth as needed (for migraine; max 1 packet per day).    Dispense:  9 each    Refill:  3  . predniSONE (  DELTASONE) 10 MG tablet    Sig: Take 60mg  on day 1. Reduce by 10mg  each subsequent day. (60, 50, 40, 30, 20, 10, stop)    Dispense:  21 tablet    Refill:  0   Return in about 6 weeks (around 05/21/2015).    Suanne MarkerVIKRAM R. PENUMALLI, MD 04/09/2015, 3:45 PM Certified in Neurology, Neurophysiology and Neuroimaging  [facility] [address] [place], [place] [id number] [phone]

## 2015-04-09 NOTE — Patient Instructions (Signed)
I will check MRI.  Try rizatriptan as needed, cambia as needed.  Try prednisone dose pack.  Reduce caffeine intake.

## 2015-04-20 ENCOUNTER — Telehealth: Payer: Self-pay | Admitting: *Deleted

## 2015-04-20 NOTE — Telephone Encounter (Signed)
Spoke to the pt on the phone and informed her that her MRI results were normal. She stated a thanks and an understanding. Results signed by Dr. Marjory LiesPenumalli and will be scanned in

## 2015-05-15 ENCOUNTER — Encounter: Payer: Self-pay | Admitting: Diagnostic Neuroimaging

## 2015-05-21 ENCOUNTER — Ambulatory Visit: Payer: PRIVATE HEALTH INSURANCE | Admitting: Diagnostic Neuroimaging

## 2015-06-04 ENCOUNTER — Ambulatory Visit: Payer: PRIVATE HEALTH INSURANCE | Admitting: Diagnostic Neuroimaging

## 2015-06-18 ENCOUNTER — Ambulatory Visit: Payer: PRIVATE HEALTH INSURANCE | Admitting: Diagnostic Neuroimaging

## 2015-07-07 ENCOUNTER — Encounter: Payer: Self-pay | Admitting: Diagnostic Neuroimaging

## 2015-07-07 ENCOUNTER — Ambulatory Visit (INDEPENDENT_AMBULATORY_CARE_PROVIDER_SITE_OTHER): Payer: PRIVATE HEALTH INSURANCE | Admitting: Diagnostic Neuroimaging

## 2015-07-07 VITALS — BP 127/89 | HR 86 | Ht 65.0 in | Wt 139.4 lb

## 2015-07-07 DIAGNOSIS — G43001 Migraine without aura, not intractable, with status migrainosus: Secondary | ICD-10-CM

## 2015-07-07 NOTE — Patient Instructions (Signed)
Continue current medications. 

## 2015-07-07 NOTE — Progress Notes (Signed)
[facility]  PATIENT: Patricia Everett DOB: [birth year]  REFERRING CLINICIAN: Morris HISTORY FROM: patient (accompnied by daughter) REASON FOR VISIT: follow up   HISTORICAL  CHIEF COMPLAINT:  Chief Complaint  Patient presents with  . Headache    rm 7  . Follow-up    HISTORY OF PRESENT ILLNESS:   UPDATE 07/07/15: Since last visit, patient has improved with her headaches. Now with 4-5 days migraine per month, mainly before and during menstrual cycle. HA are controlled with rizatriptan prn; sometimes needs cambia which also helps (but doesn't taste good). Overall doing better.  PRIOR HPI (04/09/15): 29 year old female here for evaluation of headaches. March 2016 patient developed onset of occipital, posterior neck pain pressure squeezing sensation. Around this time patient had some endometrial polyps which were removed. Patient was prescribed sumatriptan by OB/GYN. Some of her headaches had photophobia, nausea, right hand numbness associated. April 12 1 week before her menstrual cycle, patient had a 15 hour migraine with nausea, severe pain. She took ibuprofen, sumatriptan, Toradol and Flexeril without significant relief. Patient had additional severe headaches on April 14, April 15, April 18-21st, April 27, April 28, April 30, May 3-4, May 6, May 7, May 9-11. Patient missed several days of work during this time. Patient tried Fioricet and Toradol. She had Toradol and Phenergan injection which seemed to help.  No specific triggering factors that have changed. She does drink 6 cans of [facility] every day, and has done this for several years. She has interrupted sleep for the past 2 years related to her 90-year-old daughter who wakes up twice per night. 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  No family history of migraine. She has a maternal uncle who had cluster headaches.   Patient has had kidney stones in the  past.  I saw patient in 2014 for left foot numbness, diagnosed with left peroneal neuropathy. The symptoms are stable and do not bother the patient that much.   REVIEW OF SYSTEMS: Full 14 system review of systems performed and notable only for headache.   ALLERGIES: Allergies  Allergen Reactions  . Aspirin     Patient unsure; mother and sister have anaphylaxis reaction    HOME MEDICATIONS: Outpatient Prescriptions Prior to Visit  Medication Sig Dispense Refill  . Diclofenac Potassium (CAMBIA) 50 MG PACK Take 50 mg by mouth as needed (for migraine; max 1 packet per day). 9 each 3  . ketorolac (TORADOL) 10 MG tablet Take 10 mg by mouth every 6 (six) hours as needed.    . rizatriptan (MAXALT-MLT) 10 MG disintegrating tablet Take 1 tablet (10 mg total) by mouth as needed for migraine. May repeat in 2 hours if needed 9 tablet 11  . predniSONE (DELTASONE) 10 MG tablet Take 60mg  on day 1. Reduce by 10mg  each subsequent day. (60, 50, 40, 30, 20, 10, stop) 21 tablet 0   No facility-administered medications prior to visit.    PAST MEDICAL HISTORY: Past Medical History  Diagnosis Date  . No pertinent past medical history   . Pain in back   . Pain in hip     PAST SURGICAL HISTORY: Past Surgical History  Procedure Laterality Date  . No past surgeries    . Polypectomy  01/2015    FAMILY HISTORY: Family History  Problem Relation Age of Onset  . Other Neg Hx   . Migraines Sister   . Heart disease Maternal Grandmother   .  Heart disease Maternal Grandfather   . Neuropathy Paternal Grandmother   . Ovarian cancer Paternal Grandmother   . Ovarian cancer Paternal Grandfather     SOCIAL HISTORY:  History   Social History  . Marital Status: Married    Spouse Name: Erisa Mehlman  . Number of Children: 2  . Years of Education: college   Occupational History  .      [employer]   Social History Main Topics  . Smoking status: Never Smoker   . Smokeless tobacco: Never Used   . Alcohol Use: No  . Drug Use: No  . Sexual Activity: Yes    Birth Control/ Protection: None   Other Topics Concern  . Not on file   Social History Narrative   Pt lives at home with her spouse and children.   Caffeine Use: 6 or more cans daily.     PHYSICAL EXAM  Filed Vitals:   07/07/15 1533  BP: 127/89  Pulse: 86  Height:  (1.651 m)  Weight: 139 lb 6.4 oz (63.231 kg)    Body mass index is 23.2 kg/(m^2).  No exam data present  No flowsheet data found.  GENERAL EXAM: Patient is in no distress; well developed, nourished and groomed; neck is supple  CARDIOVASCULAR: Regular rate and rhythm, no murmurs, no carotid bruits  NEUROLOGIC: MENTAL STATUS: awake, alert, oriented to person, place and time, recent and remote memory intact, normal attention and concentration, language fluent, comprehension intact, naming intact, fund of knowledge appropriate CRANIAL NERVE: no papilledema on fundoscopic exam, pupils equal and reactive to light, visual fields full to confrontation, extraocular muscles intact, no nystagmus, facial sensation and strength symmetric, hearing intact, palate elevates symmetrically, uvula midline, shoulder shrug symmetric, tongue midline. MOTOR: normal bulk and tone, full strength in the BUE, BLE SENSORY: normal and symmetric to light touch, pinprick, temperature, vibration COORDINATION: finger-nose-finger, fine finger movements normal REFLEXES: deep tendon reflexes present and symmetric GAIT/STATION: narrow based gait; able to walk on toes, heels and tandem; romberg is negative    DIAGNOSTIC DATA (LABS, IMAGING, TESTING) - I reviewed patient records, labs, notes, testing and imaging myself where available.  Lab Results  Component Value Date   WBC 9.7 12/06/2012   HGB 9.4* 12/06/2012   HCT 28.7* 12/06/2012   MCV 86.2 12/06/2012   PLT 161 12/06/2012   No results found for: NA, K, CL, CO2, GLUCOSE, BUN, CREATININE, CALCIUM, PROT, ALBUMIN, AST,  ALT, ALKPHOS, BILITOT, GFRNONAA, GFRAA No results found for: CHOL, HDL, LDLCALC, LDLDIRECT, TRIG, CHOLHDL No results found for: ZOXW9U No results found for: VITAMINB12 No results found for: TSH   04/14/15 MRI brain ([facility]) - normal    ASSESSMENT AND PLAN  29 y.o. year old female here with new onset headaches in March and April 2016. Headaches have migraine features. No previous similar headaches. Due to severity and duration of headaches, new onset characteristic, and associated focal neurologic symptoms (right hand numbness) we checked MRI brain which was normal. Now migraines improving spontaneously, and well controlled with rizatriptan and cambia.  Dx: Migraine without aura and with status migrainosus, not intractable    PLAN: - Rizatriptan as needed for breakthrough headache - Cambia as needed for breakthrough headache  Return in about 6 months (around 01/07/2016).    Suanne Marker, MD 07/07/2015, 4:25 PM Certified in Neurology, Neurophysiology and Neuroimaging  [facility] [address], [address] [place], [place] [address] [phone]

## 2016-01-07 ENCOUNTER — Ambulatory Visit: Payer: PRIVATE HEALTH INSURANCE | Admitting: Diagnostic Neuroimaging

## 2016-11-23 ENCOUNTER — Encounter (HOSPITAL_BASED_OUTPATIENT_CLINIC_OR_DEPARTMENT_OTHER): Payer: Self-pay

## 2016-11-23 ENCOUNTER — Ambulatory Visit (HOSPITAL_BASED_OUTPATIENT_CLINIC_OR_DEPARTMENT_OTHER): Admit: 2016-11-23 | Payer: Self-pay | Admitting: Obstetrics and Gynecology

## 2016-11-23 SURGERY — LAPAROSCOPY, DIAGNOSTIC
Anesthesia: General

## 2019-09-06 DIAGNOSIS — Z3200 Encounter for pregnancy test, result unknown: Secondary | ICD-10-CM | POA: Diagnosis not present

## 2019-09-06 DIAGNOSIS — Z3689 Encounter for other specified antenatal screening: Secondary | ICD-10-CM | POA: Diagnosis not present

## 2019-10-01 DIAGNOSIS — Z3201 Encounter for pregnancy test, result positive: Secondary | ICD-10-CM | POA: Diagnosis not present

## 2019-10-21 DIAGNOSIS — Z3689 Encounter for other specified antenatal screening: Secondary | ICD-10-CM | POA: Diagnosis not present

## 2019-10-21 DIAGNOSIS — Z118 Encounter for screening for other infectious and parasitic diseases: Secondary | ICD-10-CM | POA: Diagnosis not present

## 2019-10-21 DIAGNOSIS — Z1151 Encounter for screening for human papillomavirus (HPV): Secondary | ICD-10-CM | POA: Diagnosis not present

## 2019-10-21 DIAGNOSIS — Z124 Encounter for screening for malignant neoplasm of cervix: Secondary | ICD-10-CM | POA: Diagnosis not present

## 2019-10-21 DIAGNOSIS — Z3481 Encounter for supervision of other normal pregnancy, first trimester: Secondary | ICD-10-CM | POA: Diagnosis not present

## 2019-10-21 LAB — OB RESULTS CONSOLE ANTIBODY SCREEN: Antibody Screen: NEGATIVE

## 2019-10-21 LAB — OB RESULTS CONSOLE GC/CHLAMYDIA
Chlamydia: NEGATIVE
Gonorrhea: NEGATIVE

## 2019-10-21 LAB — OB RESULTS CONSOLE HEPATITIS B SURFACE ANTIGEN: Hepatitis B Surface Ag: NEGATIVE

## 2019-10-21 LAB — OB RESULTS CONSOLE RUBELLA ANTIBODY, IGM: Rubella: IMMUNE

## 2019-10-21 LAB — OB RESULTS CONSOLE ABO/RH: RH Type: POSITIVE

## 2019-10-21 LAB — OB RESULTS CONSOLE HIV ANTIBODY (ROUTINE TESTING): HIV: NONREACTIVE

## 2019-10-21 LAB — OB RESULTS CONSOLE RPR: RPR: NONREACTIVE

## 2019-11-01 ENCOUNTER — Other Ambulatory Visit: Payer: Self-pay | Admitting: Obstetrics and Gynecology

## 2019-11-01 DIAGNOSIS — N63 Unspecified lump in unspecified breast: Secondary | ICD-10-CM

## 2019-11-08 ENCOUNTER — Other Ambulatory Visit: Payer: Self-pay

## 2019-11-08 ENCOUNTER — Ambulatory Visit
Admission: RE | Admit: 2019-11-08 | Discharge: 2019-11-08 | Disposition: A | Discharge status: Home / Self Care | Payer: BC Managed Care – PPO | Source: Ambulatory Visit | Attending: Obstetrics and Gynecology | Admitting: Obstetrics and Gynecology

## 2019-11-08 DIAGNOSIS — N6489 Other specified disorders of breast: Secondary | ICD-10-CM | POA: Diagnosis not present

## 2019-11-08 DIAGNOSIS — N63 Unspecified lump in unspecified breast: Secondary | ICD-10-CM

## 2019-11-15 DIAGNOSIS — Z3482 Encounter for supervision of other normal pregnancy, second trimester: Secondary | ICD-10-CM | POA: Diagnosis not present

## 2019-11-29 NOTE — L&D Delivery Note (Signed)
Delivery Note At  2333 a viable and healthy female was delivered over intact perineum via svd (Presentation:cephalic ; LOA ).  APGAR: 8, 9; weight  not yet done.   Placenta status:delivered spontaneously, intact .  Cord:  3vv with the following complications: none.  Anesthesia:  Epidural  Episiotomy:  none Lacerations:  none Suture Repair: n/a Est. Blood Loss (mL):    Mom to postpartum.  Baby to Couplet care / Skin to Skin.  Patricia Everett 05/06/2020, 11:43 PM

## 2019-12-13 DIAGNOSIS — Z361 Encounter for antenatal screening for raised alphafetoprotein level: Secondary | ICD-10-CM | POA: Diagnosis not present

## 2019-12-13 DIAGNOSIS — Z363 Encounter for antenatal screening for malformations: Secondary | ICD-10-CM | POA: Diagnosis not present

## 2019-12-13 DIAGNOSIS — Z3482 Encounter for supervision of other normal pregnancy, second trimester: Secondary | ICD-10-CM | POA: Diagnosis not present

## 2020-01-10 DIAGNOSIS — E860 Dehydration: Secondary | ICD-10-CM | POA: Diagnosis not present

## 2020-02-14 DIAGNOSIS — O99612 Diseases of the digestive system complicating pregnancy, second trimester: Secondary | ICD-10-CM | POA: Diagnosis not present

## 2020-02-14 DIAGNOSIS — Z3A27 27 weeks gestation of pregnancy: Secondary | ICD-10-CM | POA: Diagnosis not present

## 2020-02-21 DIAGNOSIS — O99612 Diseases of the digestive system complicating pregnancy, second trimester: Secondary | ICD-10-CM | POA: Diagnosis not present

## 2020-02-21 DIAGNOSIS — Z3A28 28 weeks gestation of pregnancy: Secondary | ICD-10-CM | POA: Diagnosis not present

## 2020-02-21 DIAGNOSIS — Z23 Encounter for immunization: Secondary | ICD-10-CM | POA: Diagnosis not present

## 2020-02-21 DIAGNOSIS — Z3689 Encounter for other specified antenatal screening: Secondary | ICD-10-CM | POA: Diagnosis not present

## 2020-02-27 DIAGNOSIS — Z3A29 29 weeks gestation of pregnancy: Secondary | ICD-10-CM | POA: Diagnosis not present

## 2020-02-27 DIAGNOSIS — O99810 Abnormal glucose complicating pregnancy: Secondary | ICD-10-CM | POA: Diagnosis not present

## 2020-03-06 DIAGNOSIS — Z3A30 30 weeks gestation of pregnancy: Secondary | ICD-10-CM | POA: Diagnosis not present

## 2020-03-06 DIAGNOSIS — O99810 Abnormal glucose complicating pregnancy: Secondary | ICD-10-CM | POA: Diagnosis not present

## 2020-03-20 DIAGNOSIS — Z3482 Encounter for supervision of other normal pregnancy, second trimester: Secondary | ICD-10-CM | POA: Diagnosis not present

## 2020-04-01 DIAGNOSIS — M25551 Pain in right hip: Secondary | ICD-10-CM | POA: Diagnosis not present

## 2020-04-01 DIAGNOSIS — M545 Low back pain: Secondary | ICD-10-CM | POA: Diagnosis not present

## 2020-04-01 DIAGNOSIS — M25552 Pain in left hip: Secondary | ICD-10-CM | POA: Diagnosis not present

## 2020-04-02 DIAGNOSIS — Z3A34 34 weeks gestation of pregnancy: Secondary | ICD-10-CM | POA: Diagnosis not present

## 2020-04-02 DIAGNOSIS — O99613 Diseases of the digestive system complicating pregnancy, third trimester: Secondary | ICD-10-CM | POA: Diagnosis not present

## 2020-04-02 DIAGNOSIS — R030 Elevated blood-pressure reading, without diagnosis of hypertension: Secondary | ICD-10-CM | POA: Diagnosis not present

## 2020-04-06 DIAGNOSIS — Z3A35 35 weeks gestation of pregnancy: Secondary | ICD-10-CM | POA: Diagnosis not present

## 2020-04-06 DIAGNOSIS — O99013 Anemia complicating pregnancy, third trimester: Secondary | ICD-10-CM | POA: Diagnosis not present

## 2020-04-13 DIAGNOSIS — O403XX0 Polyhydramnios, third trimester, not applicable or unspecified: Secondary | ICD-10-CM | POA: Diagnosis not present

## 2020-04-13 DIAGNOSIS — Z3685 Encounter for antenatal screening for Streptococcus B: Secondary | ICD-10-CM | POA: Diagnosis not present

## 2020-04-13 DIAGNOSIS — Z3A36 36 weeks gestation of pregnancy: Secondary | ICD-10-CM | POA: Diagnosis not present

## 2020-04-13 LAB — OB RESULTS CONSOLE GBS: GBS: NEGATIVE

## 2020-04-17 DIAGNOSIS — Z3A36 36 weeks gestation of pregnancy: Secondary | ICD-10-CM | POA: Diagnosis not present

## 2020-04-23 DIAGNOSIS — Z3A37 37 weeks gestation of pregnancy: Secondary | ICD-10-CM | POA: Diagnosis not present

## 2020-04-23 DIAGNOSIS — O403XX0 Polyhydramnios, third trimester, not applicable or unspecified: Secondary | ICD-10-CM | POA: Diagnosis not present

## 2020-04-28 DIAGNOSIS — O403XX0 Polyhydramnios, third trimester, not applicable or unspecified: Secondary | ICD-10-CM | POA: Diagnosis not present

## 2020-04-28 DIAGNOSIS — Z3A38 38 weeks gestation of pregnancy: Secondary | ICD-10-CM | POA: Diagnosis not present

## 2020-04-28 DIAGNOSIS — N898 Other specified noninflammatory disorders of vagina: Secondary | ICD-10-CM | POA: Diagnosis not present

## 2020-04-30 ENCOUNTER — Encounter (HOSPITAL_COMMUNITY): Payer: Self-pay | Admitting: *Deleted

## 2020-04-30 ENCOUNTER — Telehealth (HOSPITAL_COMMUNITY): Payer: Self-pay | Admitting: *Deleted

## 2020-04-30 NOTE — Telephone Encounter (Signed)
Preadmission screen  

## 2020-05-01 ENCOUNTER — Other Ambulatory Visit: Payer: Self-pay | Admitting: Obstetrics and Gynecology

## 2020-05-04 ENCOUNTER — Other Ambulatory Visit (HOSPITAL_COMMUNITY): Payer: BC Managed Care – PPO

## 2020-05-05 ENCOUNTER — Other Ambulatory Visit (HOSPITAL_COMMUNITY): Payer: Self-pay | Admitting: *Deleted

## 2020-05-05 ENCOUNTER — Other Ambulatory Visit: Payer: Self-pay | Admitting: Obstetrics and Gynecology

## 2020-05-05 ENCOUNTER — Other Ambulatory Visit (HOSPITAL_COMMUNITY)
Admission: RE | Admit: 2020-05-05 | Discharge: 2020-05-05 | Disposition: A | Discharge status: Home / Self Care | Payer: BC Managed Care – PPO | Source: Ambulatory Visit | Attending: Obstetrics and Gynecology | Admitting: Obstetrics and Gynecology

## 2020-05-05 DIAGNOSIS — M546 Pain in thoracic spine: Secondary | ICD-10-CM | POA: Diagnosis not present

## 2020-05-05 DIAGNOSIS — Z3A39 39 weeks gestation of pregnancy: Secondary | ICD-10-CM | POA: Diagnosis not present

## 2020-05-05 DIAGNOSIS — D649 Anemia, unspecified: Secondary | ICD-10-CM | POA: Diagnosis not present

## 2020-05-05 DIAGNOSIS — M545 Low back pain: Secondary | ICD-10-CM | POA: Diagnosis not present

## 2020-05-05 DIAGNOSIS — R32 Unspecified urinary incontinence: Secondary | ICD-10-CM | POA: Diagnosis not present

## 2020-05-05 DIAGNOSIS — O9089 Other complications of the puerperium, not elsewhere classified: Secondary | ICD-10-CM | POA: Diagnosis not present

## 2020-05-05 DIAGNOSIS — O9902 Anemia complicating childbirth: Secondary | ICD-10-CM | POA: Diagnosis not present

## 2020-05-05 DIAGNOSIS — D509 Iron deficiency anemia, unspecified: Secondary | ICD-10-CM | POA: Diagnosis not present

## 2020-05-05 DIAGNOSIS — O9081 Anemia of the puerperium: Secondary | ICD-10-CM | POA: Diagnosis not present

## 2020-05-05 DIAGNOSIS — Z3A00 Weeks of gestation of pregnancy not specified: Secondary | ICD-10-CM | POA: Diagnosis not present

## 2020-05-05 DIAGNOSIS — D62 Acute posthemorrhagic anemia: Secondary | ICD-10-CM | POA: Diagnosis not present

## 2020-05-05 DIAGNOSIS — O403XX0 Polyhydramnios, third trimester, not applicable or unspecified: Secondary | ICD-10-CM | POA: Diagnosis not present

## 2020-05-05 DIAGNOSIS — O1415 Severe pre-eclampsia, complicating the puerperium: Secondary | ICD-10-CM | POA: Diagnosis not present

## 2020-05-05 DIAGNOSIS — Z01812 Encounter for preprocedural laboratory examination: Secondary | ICD-10-CM | POA: Insufficient documentation

## 2020-05-05 DIAGNOSIS — O99893 Other specified diseases and conditions complicating puerperium: Secondary | ICD-10-CM | POA: Diagnosis not present

## 2020-05-05 DIAGNOSIS — Z20822 Contact with and (suspected) exposure to covid-19: Secondary | ICD-10-CM | POA: Diagnosis not present

## 2020-05-05 LAB — SARS CORONAVIRUS 2 (TAT 6-24 HRS): SARS Coronavirus 2: NEGATIVE

## 2020-05-06 ENCOUNTER — Inpatient Hospital Stay (HOSPITAL_COMMUNITY): Payer: BC Managed Care – PPO | Admitting: Anesthesiology

## 2020-05-06 ENCOUNTER — Encounter (HOSPITAL_COMMUNITY): Payer: Self-pay | Admitting: Obstetrics and Gynecology

## 2020-05-06 ENCOUNTER — Inpatient Hospital Stay (HOSPITAL_COMMUNITY)
Admission: AD | Admit: 2020-05-06 | Discharge: 2020-05-08 | DRG: 806 | Disposition: A | Discharge status: Home / Self Care | Payer: BC Managed Care – PPO | Attending: Obstetrics and Gynecology | Admitting: Obstetrics and Gynecology

## 2020-05-06 ENCOUNTER — Inpatient Hospital Stay (HOSPITAL_COMMUNITY): Payer: BC Managed Care – PPO

## 2020-05-06 DIAGNOSIS — Z3A00 Weeks of gestation of pregnancy not specified: Secondary | ICD-10-CM | POA: Diagnosis not present

## 2020-05-06 DIAGNOSIS — Z3490 Encounter for supervision of normal pregnancy, unspecified, unspecified trimester: Secondary | ICD-10-CM | POA: Diagnosis present

## 2020-05-06 DIAGNOSIS — O99893 Other specified diseases and conditions complicating puerperium: Secondary | ICD-10-CM | POA: Diagnosis not present

## 2020-05-06 DIAGNOSIS — O9902 Anemia complicating childbirth: Secondary | ICD-10-CM | POA: Diagnosis not present

## 2020-05-06 DIAGNOSIS — D62 Acute posthemorrhagic anemia: Secondary | ICD-10-CM | POA: Diagnosis not present

## 2020-05-06 DIAGNOSIS — N39492 Postural (urinary) incontinence: Secondary | ICD-10-CM

## 2020-05-06 DIAGNOSIS — O403XX0 Polyhydramnios, third trimester, not applicable or unspecified: Secondary | ICD-10-CM | POA: Diagnosis not present

## 2020-05-06 DIAGNOSIS — Z20822 Contact with and (suspected) exposure to covid-19: Secondary | ICD-10-CM | POA: Diagnosis present

## 2020-05-06 DIAGNOSIS — Z3A39 39 weeks gestation of pregnancy: Secondary | ICD-10-CM

## 2020-05-06 DIAGNOSIS — D509 Iron deficiency anemia, unspecified: Secondary | ICD-10-CM | POA: Diagnosis present

## 2020-05-06 DIAGNOSIS — O9081 Anemia of the puerperium: Secondary | ICD-10-CM | POA: Diagnosis not present

## 2020-05-06 DIAGNOSIS — D649 Anemia, unspecified: Secondary | ICD-10-CM | POA: Diagnosis not present

## 2020-05-06 DIAGNOSIS — M545 Low back pain: Secondary | ICD-10-CM | POA: Diagnosis not present

## 2020-05-06 DIAGNOSIS — R32 Unspecified urinary incontinence: Secondary | ICD-10-CM | POA: Diagnosis not present

## 2020-05-06 LAB — ABO/RH: ABO/RH(D): O POS

## 2020-05-06 LAB — CBC
HCT: 32.9 % — ABNORMAL LOW (ref 36.0–46.0)
HCT: 35.6 % — ABNORMAL LOW (ref 36.0–46.0)
Hemoglobin: 10.6 g/dL — ABNORMAL LOW (ref 12.0–15.0)
Hemoglobin: 11.7 g/dL — ABNORMAL LOW (ref 12.0–15.0)
MCH: 27.9 pg (ref 26.0–34.0)
MCH: 28.3 pg (ref 26.0–34.0)
MCHC: 32.2 g/dL (ref 30.0–36.0)
MCHC: 32.9 g/dL (ref 30.0–36.0)
MCV: 86.6 fL (ref 80.0–100.0)
MCV: 86.2 fL (ref 80.0–100.0)
Platelets: 179 10*3/uL (ref 150–400)
Platelets: 191 10*3/uL (ref 150–400)
RBC: 3.8 MIL/uL — ABNORMAL LOW (ref 3.87–5.11)
RBC: 4.13 MIL/uL (ref 3.87–5.11)
RDW: 15 % (ref 11.5–15.5)
RDW: 15.1 % (ref 11.5–15.5)
WBC: 7.7 10*3/uL (ref 4.0–10.5)
WBC: 8.9 10*3/uL (ref 4.0–10.5)
nRBC: 0 % (ref 0.0–0.2)
nRBC: 0 % (ref 0.0–0.2)

## 2020-05-06 LAB — COMPREHENSIVE METABOLIC PANEL
ALT: 12 U/L (ref 0–44)
AST: 18 U/L (ref 15–41)
Albumin: 2.8 g/dL — ABNORMAL LOW (ref 3.5–5.0)
Alkaline Phosphatase: 135 U/L — ABNORMAL HIGH (ref 38–126)
Anion gap: 10 (ref 5–15)
BUN: <5 mg/dL — ABNORMAL LOW (ref 6–20)
CO2: 21 mmol/L — ABNORMAL LOW (ref 22–32)
Calcium: 8.8 mg/dL — ABNORMAL LOW (ref 8.9–10.3)
Chloride: 107 mmol/L (ref 98–111)
Creatinine, Ser: 0.63 mg/dL (ref 0.44–1.00)
GFR calc Af Amer: >60 mL/min (ref >60)
GFR calc non Af Amer: >60 mL/min (ref >60)
Glucose, Bld: 89 mg/dL (ref 70–99)
Potassium: 3.1 mmol/L — ABNORMAL LOW (ref 3.5–5.1)
Sodium: 138 mmol/L (ref 135–145)
Total Bilirubin: 0.7 mg/dL (ref 0.3–1.2)
Total Protein: 6.4 g/dL — ABNORMAL LOW (ref 6.5–8.1)

## 2020-05-06 LAB — TYPE AND SCREEN
ABO/RH(D): O POS
Antibody Screen: NEGATIVE

## 2020-05-06 LAB — URIC ACID: Uric Acid, Serum: 4.9 mg/dL (ref 2.5–7.1)

## 2020-05-06 MED ORDER — ACETAMINOPHEN 325 MG PO TABS
650.0000 mg | ORAL_TABLET | ORAL | Status: DC | PRN
  Administered 2020-05-06: 650 mg via ORAL
  Filled 2020-05-06: qty 2

## 2020-05-06 MED ORDER — FENTANYL-BUPIVACAINE-NACL 0.5-0.125-0.9 MG/250ML-% EP SOLN
12.0000 mL/h | EPIDURAL | Status: DC | PRN
  Filled 2020-05-06: qty 250

## 2020-05-06 MED ORDER — PHENYLEPHRINE 40 MCG/ML (10ML) SYRINGE FOR IV PUSH (FOR BLOOD PRESSURE SUPPORT): 80.0000 ug | PREFILLED_SYRINGE | INTRAVENOUS | Status: DC | PRN

## 2020-05-06 MED ORDER — DIPHENHYDRAMINE HCL 50 MG/ML IJ SOLN: 12.5000 mg | INTRAMUSCULAR | Status: DC | PRN

## 2020-05-06 MED ORDER — FAMOTIDINE IN NACL 20-0.9 MG/50ML-% IV SOLN
20.0000 mg | INTRAVENOUS | Status: DC
  Administered 2020-05-06: 20 mg via INTRAVENOUS
  Filled 2020-05-06: qty 50

## 2020-05-06 MED ORDER — CALCIUM CARBONATE ANTACID 500 MG PO CHEW
1.0000 | CHEWABLE_TABLET | Freq: Three times a day (TID) | ORAL | Status: DC | PRN
  Filled 2020-05-06: qty 1

## 2020-05-06 MED ORDER — SODIUM CHLORIDE (PF) 0.9 % IJ SOLN
INTRAMUSCULAR | Status: DC | PRN
  Administered 2020-05-06: 12 mL/h via EPIDURAL

## 2020-05-06 MED ORDER — EPHEDRINE 5 MG/ML INJ: 10.0000 mg | INTRAVENOUS | Status: DC | PRN

## 2020-05-06 MED ORDER — SOD CITRATE-CITRIC ACID 500-334 MG/5ML PO SOLN: 30.0000 mL | ORAL | Status: DC | PRN

## 2020-05-06 MED ORDER — TERBUTALINE SULFATE 1 MG/ML IJ SOLN: 0.2500 mg | Freq: Once | INTRAMUSCULAR | Status: DC | PRN

## 2020-05-06 MED ORDER — OXYTOCIN-SODIUM CHLORIDE 30-0.9 UT/500ML-% IV SOLN
1.0000 m[IU]/min | INTRAVENOUS | Status: DC
  Administered 2020-05-06: 2 m[IU]/min via INTRAVENOUS
  Filled 2020-05-06: qty 500

## 2020-05-06 MED ORDER — LIDOCAINE HCL (PF) 1 % IJ SOLN: 30.0000 mL | INTRAMUSCULAR | Status: DC | PRN

## 2020-05-06 MED ORDER — LIDOCAINE HCL (PF) 1 % IJ SOLN
INTRAMUSCULAR | Status: DC | PRN
  Administered 2020-05-06: 10 mL via EPIDURAL
  Administered 2020-05-06: 2 mL via EPIDURAL

## 2020-05-06 MED ORDER — ONDANSETRON HCL 4 MG/2ML IJ SOLN: 4.0000 mg | Freq: Four times a day (QID) | INTRAMUSCULAR | Status: DC | PRN

## 2020-05-06 MED ORDER — LACTATED RINGERS IV SOLN
500.0000 mL | INTRAVENOUS | Status: DC | PRN
  Administered 2020-05-06: 500 mL via INTRAVENOUS

## 2020-05-06 MED ORDER — LACTATED RINGERS IV SOLN
500.0000 mL | Freq: Once | INTRAVENOUS | Status: AC
  Administered 2020-05-06: 500 mL via INTRAVENOUS

## 2020-05-06 MED ORDER — OXYTOCIN-SODIUM CHLORIDE 30-0.9 UT/500ML-% IV SOLN
2.5000 [IU]/h | INTRAVENOUS | Status: DC
  Administered 2020-05-07: 2.5 [IU]/h via INTRAVENOUS
  Filled 2020-05-06: qty 500

## 2020-05-06 MED ORDER — LACTATED RINGERS IV SOLN: INTRAVENOUS | Status: DC

## 2020-05-06 MED ORDER — OXYTOCIN BOLUS FROM INFUSION
500.0000 mL | Freq: Once | INTRAVENOUS | Status: AC
  Administered 2020-05-06: 500 mL via INTRAVENOUS

## 2020-05-06 NOTE — H&P (Signed)
Patricia Everett is a 34 y.o. G3P2002 at [redacted]w[redacted]d gestation presents for IOL, polyhydramnios.  Uncomfortable with contractions, no vb/lof.  Antepartum course: polyhydramnios , normal antenatal testing; bpp 8/8 yesterday, cephalic, afi 28cm; efw 88% (7'2"), bpd 99%, ac 91% [facility] at [facility] since 11 wks.  See complete pre-natal records  History OB History    Gravida  3   Para  2   Term  2   Preterm      AB      Living  2     SAB      TAB      Ectopic      Multiple      Live Births  2          Past Medical History:  Diagnosis Date  . Family history of adverse reaction to anesthesia    sister hard to wake up  . No pertinent past medical history   . Pain in back   . Pain in hip    Past Surgical History:  Procedure Laterality Date  . NO PAST SURGERIES    . POLYPECTOMY  01/2015   Family History: family history includes Heart disease in her maternal grandfather and maternal grandmother; Migraines in her sister; Neuropathy in her paternal grandmother; Ovarian cancer in her paternal grandmother. Social History:  reports that she has never smoked. She has never used smokeless tobacco. She reports that she does not drink alcohol or use drugs.  ROS: See above otherwise negative  Prenatal labs:  ABO, Rh: --/--/O POS, O POS Performed at [facility], [address] [place], [place] [address]  [phone]) Antibody: NEG (06/09 1421) Rubella: Immune (11/23 0000) RPR: Nonreactive (11/23 0000)  HBsAg: Negative (11/23 0000)  HIV:Non-reactive (11/23 0000)  GBS: Negative/-- (05/17 0000)  1 hr Glucola: failed early gtt, passed 3 hr gtt Genetic screening: Normal Anatomy US: Normal  Physical Exam:   Dilation: 4 Effacement (%): 50 Station: Ballotable Exam by:: Cher Nakai RNC Blood pressure (!) 128/92, pulse 82, temperature 98.6 F (37 C), temperature source Oral, resp. rate 18, height 5\' 5"  (1.651 m), weight 80.3 kg. A&O x 3 HEENT: Normal Lungs:  CTAB CV: RRR Abdominal: Soft, Non-tender, Gravid and Estimated fetal weight: 8 lbs  Lower Extremities: Non-edematous, Non-tender  Pelvic Exam:      Dilatation: 5cm     Effacement: 50%     Station: High; ballotable     Presentation: Cephalic  Labs:  CBC:  Lab Results  Component Value Date   WBC 7.7 05/06/2020   RBC 3.80 (L) 05/06/2020   HGB 10.6 (L) 05/06/2020   HCT 32.9 (L) 05/06/2020   MCV 86.6 05/06/2020   MCH 27.9 05/06/2020   MCHC 32.2 05/06/2020   RDW 15.0 05/06/2020   PLT 179 05/06/2020   CMP: No results found for: NA, K, CL, CO2, GLUCOSE, BUN, CREATININE, CALCIUM, PROT, AST, ALT, ALBUMIN, ALKPHOS, BILITOT, GFRNONAA, GFRAA, ANIONGAP Urine: Lab Results  Component Value Date   COLORURINE YELLOW 11/03/2012   APPEARANCEUR CLEAR 11/03/2012   LABSPEC <1.005 (L) 11/03/2012   PHURINE 6.0 11/03/2012   GLUCOSEU NEGATIVE 11/03/2012   HGBUR NEGATIVE 11/03/2012   BILIRUBINUR NEGATIVE 11/03/2012   KETONESUR NEGATIVE 11/03/2012   PROTEINUR NEGATIVE 11/03/2012   NITRITE NEGATIVE 11/03/2012   LEUKOCYTESUR SMALL (A) 11/03/2012     Prenatal Transfer Tool  Maternal Diabetes: No Genetic Screening: Declined Maternal Ultrasounds/Referrals: Normal Fetal Ultrasounds or other Referrals:  None Maternal Substance Abuse:  No  Significant Maternal Medications:  None Significant Maternal Lab Results: Group B Strep negative  FHT: 130s, nml variaiblity, +accels, no decels TOCO: q 2  Assessment/Plan:  34 y.o. G3P2002 at [redacted]w[redacted]d gestation   1. IOL - contin pitocin, plan svd; unable to arom d/t fetal station, contin pitocin and allow fetal descent 2. Fetal status reassuring 3. gbs neg 4. polyhydramnios  5. Anemia of pregnancy 6. Mild range bps, pt is uncomfortable, but will check [place] labs; asymptomatic  Charyl Bigger 05/06/2020, 5:50 PM

## 2020-05-06 NOTE — Progress Notes (Signed)
Comfortable after epidural  Patient Vitals for the past 24 hrs:  BP Temp Temp src Pulse Resp SpO2 Height Weight  05/06/20 2100 131/85 -- -- 74 16 -- -- --  05/06/20 2050 -- 97.9 F (36.6 C) Oral -- -- -- -- --  05/06/20 2035 113/85 -- -- 81 17 99 % -- --  05/06/20 2030 124/90 -- -- 76 17 99 % -- --  05/06/20 2025 (!) 131/94 -- -- 83 17 99 % -- --  05/06/20 2020 (!) 137/91 -- -- 81 17 99 % -- --  05/06/20 2017 128/83 -- -- 78 17 99 % -- --  05/06/20 2010 133/82 -- -- 77 16 99 % -- --  05/06/20 2008 120/85 -- -- 77 16 -- -- --  05/06/20 2005 120/85 -- -- 77 16 99 % -- --  05/06/20 2003 134/86 98.9 F (37.2 C) Oral 90 16 -- -- --  05/06/20 2000 -- -- -- -- -- 99 % -- --  05/06/20 1959 -- -- -- -- -- 99 % -- --  05/06/20 1930 -- -- -- -- 16 -- -- --  05/06/20 1803 130/82 -- -- 78 18 -- -- --  05/06/20 1656 (!) 128/92 -- -- 82 18 -- -- --  05/06/20 1514 126/80 -- -- 76 18 -- -- --  05/06/20 1359 (!) 125/94 98.6 F (37 C) Oral (!) 101 18 -- 5\' 5"  (1.651 m) 80.3 kg   A&ox3 nml respirations Abd: binder in place, gravid CX: 5-6/60/-2; arom with copious clear fluid; iupc placed LE: no edema, nt bilat  Fht: 130s, nml variability, +accels, no decels Toco: q 1-2 min  A/P: iup at 39.2 wga 1. Active labor - contin pitocin, plan svd 2. gbs neg 3. Polyhydramnios 4. Fetal status reassuring

## 2020-05-06 NOTE — Anesthesia Procedure Notes (Signed)
Epidural Patient location during procedure: OB Start time: 05/06/2020 7:52 PM End time: 05/06/2020 8:02 PM  Staffing Anesthesiologist: Lannie Fields, DO Performed: anesthesiologist   Preanesthetic Checklist Completed: patient identified, IV checked, risks and benefits discussed, monitors and equipment checked, pre-op evaluation and timeout performed  Epidural Patient position: sitting Prep: DuraPrep and site prepped and draped Patient monitoring: continuous pulse ox, blood pressure, heart rate and cardiac monitor Approach: midline Location: L3-L4 Injection technique: LOR air  Needle:  Needle type: Tuohy  Needle gauge: 17 G Needle length: 9 cm Needle insertion depth: 6 cm Catheter type: closed end flexible Catheter size: 19 Gauge Catheter at skin depth: 11 cm Test dose: negative  Assessment Sensory level: T8 Events: blood not aspirated, injection not painful, no injection resistance, no paresthesia and negative IV test  Additional Notes Patient identified. Risks/Benefits/Options discussed with patient including but not limited to bleeding, infection, nerve damage, paralysis, failed block, incomplete pain control, headache, blood pressure changes, nausea, vomiting, reactions to medication both or allergic, itching and postpartum back pain. Confirmed with bedside nurse the patient's most recent platelet count. Confirmed with patient that they are not currently taking any anticoagulation, have any bleeding history or any family history of bleeding disorders. Patient expressed understanding and wished to proceed. All questions were answered. Sterile technique was used throughout the entire procedure. Please see nursing notes for vital signs. Test dose was given through epidural catheter and negative prior to continuing to dose epidural or start infusion. Warning signs of high block given to the patient including shortness of breath, tingling/numbness in hands, complete motor block,  or any concerning symptoms with instructions to call for help. Patient was given instructions on fall risk and not to get out of bed. All questions and concerns addressed with instructions to call with any issues or inadequate analgesia.  Reason for block:procedure for pain

## 2020-05-06 NOTE — Anesthesia Preprocedure Evaluation (Signed)
Anesthesia Evaluation  Patient identified by MRN, date of birth, ID band Patient awake    Reviewed: Allergy & Precautions, H&P , Patient's Chart, lab work & pertinent test results  Airway Mallampati: I  TM Distance: >3 FB Neck ROM: full    Dental no notable dental hx. (+) Teeth Intact   Pulmonary neg pulmonary ROS,    Pulmonary exam normal breath sounds clear to auscultation       Cardiovascular negative cardio ROS Normal cardiovascular exam Rhythm:regular Rate:Normal     Neuro/Psych negative neurological ROS  negative psych ROS   GI/Hepatic negative GI ROS, Neg liver ROS,   Endo/Other  negative endocrine ROS  Renal/GU negative Renal ROS  negative genitourinary   Musculoskeletal negative musculoskeletal ROS (+)   Abdominal Normal abdominal exam  (+)   Peds negative pediatric ROS (+)  Hematology  (+) Blood dyscrasia, anemia , hct 35.6, plt 191   Anesthesia Other Findings   Reproductive/Obstetrics (+) Pregnancy IOL for polyhydramnios                              Anesthesia Physical Anesthesia Plan  ASA: II and emergent  Anesthesia Plan: Epidural   Post-op Pain Management:    Induction:   PONV Risk Score and Plan: 2  Airway Management Planned: Natural Airway  Additional Equipment: None  Intra-op Plan:   Post-operative Plan:   Informed Consent: I have reviewed the patients History and Physical, chart, labs and discussed the procedure including the risks, benefits and alternatives for the proposed anesthesia with the patient or authorized representative who has indicated his/her understanding and acceptance.       Plan Discussed with:   Anesthesia Plan Comments:         Anesthesia Quick Evaluation

## 2020-05-07 ENCOUNTER — Encounter (HOSPITAL_COMMUNITY): Payer: Self-pay | Admitting: Obstetrics and Gynecology

## 2020-05-07 LAB — CBC
HCT: 31 % — ABNORMAL LOW (ref 36.0–46.0)
Hemoglobin: 10 g/dL — ABNORMAL LOW (ref 12.0–15.0)
MCH: 27.7 pg (ref 26.0–34.0)
MCHC: 32.3 g/dL (ref 30.0–36.0)
MCV: 85.9 fL (ref 80.0–100.0)
Platelets: 143 10*3/uL — ABNORMAL LOW (ref 150–400)
RBC: 3.61 MIL/uL — ABNORMAL LOW (ref 3.87–5.11)
RDW: 14.9 % (ref 11.5–15.5)
WBC: 11.5 10*3/uL — ABNORMAL HIGH (ref 4.0–10.5)
nRBC: 0 % (ref 0.0–0.2)

## 2020-05-07 LAB — RPR: RPR Ser Ql: NONREACTIVE

## 2020-05-07 MED ORDER — IBUPROFEN 600 MG PO TABS
600.0000 mg | ORAL_TABLET | Freq: Four times a day (QID) | ORAL | Status: DC
  Administered 2020-05-07 – 2020-05-08 (×4): 600 mg via ORAL
  Filled 2020-05-07 (×4): qty 1

## 2020-05-07 MED ORDER — ACETAMINOPHEN 325 MG PO TABS
650.0000 mg | ORAL_TABLET | ORAL | Status: DC | PRN
  Administered 2020-05-07: 650 mg via ORAL
  Filled 2020-05-07: qty 2

## 2020-05-07 MED ORDER — FAMOTIDINE 20 MG PO TABS
20.0000 mg | ORAL_TABLET | Freq: Every day | ORAL | Status: DC
  Administered 2020-05-07 – 2020-05-08 (×2): 20 mg via ORAL
  Filled 2020-05-07 (×2): qty 1

## 2020-05-07 MED ORDER — COCONUT OIL OIL: 1.0000 "application " | TOPICAL_OIL | Status: DC | PRN

## 2020-05-07 MED ORDER — ONDANSETRON HCL 4 MG/2ML IJ SOLN: 4.0000 mg | INTRAMUSCULAR | Status: DC | PRN

## 2020-05-07 MED ORDER — BENZOCAINE-MENTHOL 20-0.5 % EX AERO
1.0000 "application " | INHALATION_SPRAY | CUTANEOUS | Status: DC | PRN
  Administered 2020-05-07: 1 via TOPICAL
  Filled 2020-05-07: qty 56

## 2020-05-07 MED ORDER — PRENATAL MULTIVITAMIN CH
1.0000 | ORAL_TABLET | Freq: Every day | ORAL | Status: DC
  Administered 2020-05-07: 1 via ORAL
  Filled 2020-05-07: qty 1

## 2020-05-07 MED ORDER — TETANUS-DIPHTH-ACELL PERTUSSIS 5-2.5-18.5 LF-MCG/0.5 IM SUSP: 0.5000 mL | Freq: Once | INTRAMUSCULAR | Status: DC

## 2020-05-07 MED ORDER — ACETAMINOPHEN 500 MG PO TABS
1000.0000 mg | ORAL_TABLET | Freq: Four times a day (QID) | ORAL | Status: DC | PRN
  Administered 2020-05-07: 1000 mg via ORAL
  Filled 2020-05-07 (×2): qty 2

## 2020-05-07 MED ORDER — ONDANSETRON HCL 4 MG PO TABS: 4.0000 mg | ORAL_TABLET | ORAL | Status: DC | PRN

## 2020-05-07 MED ORDER — OXYCODONE HCL 5 MG PO TABS: 5.0000 mg | ORAL_TABLET | ORAL | Status: DC | PRN

## 2020-05-07 MED ORDER — SIMETHICONE 80 MG PO CHEW: 80.0000 mg | CHEWABLE_TABLET | ORAL | Status: DC | PRN

## 2020-05-07 MED ORDER — MAGNESIUM OXIDE 400 (241.3 MG) MG PO TABS
400.0000 mg | ORAL_TABLET | Freq: Every day | ORAL | Status: DC
  Administered 2020-05-07 – 2020-05-08 (×2): 400 mg via ORAL
  Filled 2020-05-07 (×2): qty 1

## 2020-05-07 MED ORDER — SENNOSIDES-DOCUSATE SODIUM 8.6-50 MG PO TABS: 2.0000 | ORAL_TABLET | ORAL | Status: DC

## 2020-05-07 MED ORDER — WITCH HAZEL-GLYCERIN EX PADS: 1.0000 "application " | MEDICATED_PAD | CUTANEOUS | Status: DC | PRN

## 2020-05-07 MED ORDER — DIPHENHYDRAMINE HCL 25 MG PO CAPS: 25.0000 mg | ORAL_CAPSULE | Freq: Four times a day (QID) | ORAL | Status: DC | PRN

## 2020-05-07 MED ORDER — DIBUCAINE (PERIANAL) 1 % EX OINT: 1.0000 "application " | TOPICAL_OINTMENT | CUTANEOUS | Status: DC | PRN

## 2020-05-07 MED ORDER — OXYCODONE HCL 5 MG PO TABS: 10.0000 mg | ORAL_TABLET | ORAL | Status: DC | PRN

## 2020-05-07 MED ORDER — POLYSACCHARIDE IRON COMPLEX 150 MG PO CAPS
150.0000 mg | ORAL_CAPSULE | Freq: Every day | ORAL | Status: DC
  Administered 2020-05-07 – 2020-05-08 (×2): 150 mg via ORAL
  Filled 2020-05-07 (×2): qty 1

## 2020-05-07 NOTE — Progress Notes (Signed)
PPD #1, SVD @ 2333, no repair, baby girl   S:  Reports feeling okay, tired; c/o uterine cramping. Reports no true allergy to aspirin, but her mom and sister do.  States she can take Ibuprofen without problems.              Tolerating po/ No nausea or vomiting / Denies dizziness or SOB             Bleeding is light             Pain not well controlled withTylenol             Up ad lib / ambulatory / voiding QS without difficulty  Newborn formula feeding  O:               VS: BP 115/77 (BP Location: Left Arm)   Pulse 62   Temp 98.1 F (36.7 C) (Oral)   Resp 16   Ht 5\' 5"  (1.651 m)   Wt 80.3 kg   SpO2 99%   Breastfeeding Unknown   BMI 29.45 kg/m   Patient Vitals for the past 24 hrs:  BP Temp Temp src Pulse Resp SpO2 Height Weight  05/07/20 0617 115/77 98.1 F (36.7 C) Oral 62 16 99 % -- --  05/07/20 0226 127/84 98.3 F (36.8 C) Oral 70 18 100 % -- --  05/07/20 0130 126/84 98.2 F (36.8 C) Oral 66 18 100 % -- --  05/07/20 0100 (!) 128/111 -- -- 67 16 -- -- --  05/07/20 0045 131/74 -- -- 67 16 -- -- --  05/07/20 0030 114/73 -- -- 67 16 -- -- --  05/07/20 0015 (!) 127/110 -- -- -- 16 -- -- --  05/06/20 2359 122/72 -- -- 75 16 -- -- --  05/06/20 2345 -- -- -- -- 16 -- -- --  05/06/20 2331 (!) 130/93 -- -- 85 19 -- -- --  05/06/20 2300 (!) 135/103 -- -- 83 19 -- -- --  05/06/20 2230 131/87 98.6 F (37 C) Oral 71 17 -- -- --  05/06/20 2206 126/86 -- -- 71 17 -- -- --  05/06/20 2130 126/74 -- -- 72 17 -- -- --  05/06/20 2100 131/85 -- -- 74 16 -- -- --  05/06/20 2050 -- 97.9 F (36.6 C) Oral -- -- -- -- --  05/06/20 2035 113/85 -- -- 81 17 99 % -- --  05/06/20 2030 124/90 -- -- 76 17 99 % -- --  05/06/20 2025 (!) 131/94 -- -- 83 17 99 % -- --  05/06/20 2020 (!) 137/91 -- -- 81 17 99 % -- --  05/06/20 2017 128/83 -- -- 78 17 99 % -- --  05/06/20 2010 133/82 -- -- 77 16 99 % -- --  05/06/20 2008 120/85 -- -- 77 16 -- -- --  05/06/20 2005 120/85 -- -- 77 16 99 % -- --  05/06/20  2003 134/86 98.9 F (37.2 C) Oral 90 16 -- -- --  05/06/20 2000 -- -- -- -- -- 99 % -- --  05/06/20 1959 -- -- -- -- -- 99 % -- --  05/06/20 1930 -- -- -- -- 16 -- -- --  05/06/20 1803 130/82 -- -- 78 18 -- -- --  05/06/20 1656 (!) 128/92 -- -- 82 18 -- -- --  05/06/20 1514 126/80 -- -- 76 18 -- -- --  05/06/20 1359 (!) 125/94 98.6 F (37 C) Oral (!) 101 18 -- 5\' 5"  (  1.651 m) 80.3 kg     LABS:              Recent Labs    05/06/20 1829 05/07/20 0551  WBC 8.9 11.5*  HGB 11.7* 10.0*  PLT 191 143*               Blood type: --/--/O POS, O POS Performed at [facility], [address], [place], [place] [address]  [phone]/09 1421)  Rubella: Immune (11/23 0000)                     I&O: Intake/Output      06/09 0701 - 06/10 0700 06/10 0701 - 06/11 0700   I.V. (mL/kg) 0 (0)    Other 0    IV Piggyback 0    Total Intake(mL/kg) 0 (0)    Urine (mL/kg/hr) 250    Blood 150    Total Output 400    Net -400         Urine Occurrence 1 x                  Physical Exam:             Alert and oriented X3  Lungs: Clear and unlabored  Heart: regular rate and rhythm / no murmurs  Abdomen: soft, non-tender, non-distended              Fundus: firm, non-tender, U-1  Perineum: intact  Lochia: moderate rubra on ice pack   Extremities: trace LE edema, no calf pain or tenderness    A/P: PPD # 1, SVD  ABL Anemia compounding chronic IDA   - Begin Niferex 150mg  PO daily   - Magnesium oxide 400mg  PO daily  Transient elevated BPs during labor    - monitor for PP HTN  Doing well - stable status  Routine post partum orders  Motrin 600mg  every 6 hrs  Tylenol 1000mg  every 6 hrs PRN  Encouraged to rest when baby rests  Anticipate d/c home tomorrow   , MSN, CNM [facility]

## 2020-05-07 NOTE — Anesthesia Postprocedure Evaluation (Signed)
Anesthesia Post Note  Patient: Patricia Everett  Procedure(s) Performed: AN AD HOC LABOR EPIDURAL     Patient location during evaluation: Mother Baby Anesthesia Type: Epidural Level of consciousness: awake Pain management: satisfactory to patient Vital Signs Assessment: post-procedure vital signs reviewed and stable Respiratory status: spontaneous breathing Cardiovascular status: stable Anesthetic complications: no   No complications documented.  Last Vitals:  Vitals:   05/07/20 0226 05/07/20 0617  BP: 127/84 115/77  Pulse: 70 62  Resp: 18 16  Temp: 36.8 C 36.7 C  SpO2: 100% 99%    Last Pain:  Vitals:   05/07/20 0617  TempSrc: Oral  PainSc: 0-No pain   Pain Goal:                   [employer]

## 2020-05-08 MED ORDER — CEPHALEXIN 250 MG PO CAPS
250.0000 mg | ORAL_CAPSULE | Freq: Four times a day (QID) | ORAL | Status: DC
  Filled 2020-05-08 (×2): qty 1

## 2020-05-08 MED ORDER — POLYSACCHARIDE IRON COMPLEX 150 MG PO CAPS: 150.0000 mg | ORAL_CAPSULE | Freq: Every day | ORAL | 11 refills | Status: AC

## 2020-05-08 MED ORDER — CEPHALEXIN 250 MG PO CAPS: 250.0000 mg | ORAL_CAPSULE | Freq: Four times a day (QID) | ORAL | 0 refills | Status: DC

## 2020-05-08 MED ORDER — IBUPROFEN 600 MG PO TABS: 600.0000 mg | ORAL_TABLET | Freq: Four times a day (QID) | ORAL | 5 refills | Status: AC | PRN

## 2020-05-08 NOTE — Addendum Note (Signed)
Addendum  created 05/08/20 1115 by Angela Adam, CRNA   Clinical Note Signed

## 2020-05-08 NOTE — Progress Notes (Signed)
PPD #2 s/p SVD  S: c/o low back pain . Had epidural Pt stood up and lost a significant amount of urine w/o having any sensation. Did not push long and no problem yesterday. C/o leg swelling  O: BP 131/83 (BP Location: Left Arm)   Pulse 70   Temp 97.7 F (36.5 C) (Oral)   Resp 18   Ht 5\' 5"  (1.651 m)   Wt 80.3 kg   SpO2 98%   Breastfeeding Unknown   BMI 29.45 kg/m  Lungs clear to A  Cor RRR  breast dense nipples ok Abd; uterus at  umb Non tender Pad scant lochia extr tr edema  IMP: s/p VD  Urinary incontinence Low back pain P) bladder scan for residual. May need leg bag Reviewed pp d.c orders reviewed Disc staying late to see if recurs. If still having bladder problem then leg bag. Pt opts for the leg bag. Need removal Monday office

## 2020-05-08 NOTE — Anesthesia Postprocedure Evaluation (Signed)
Anesthesia Post Note  Patient: Patricia Everett  Procedure(s) Performed: AN AD HOC LABOR EPIDURAL     Patient location during evaluation: Mother Baby Anesthesia Type: Epidural Level of consciousness: awake and alert Pain management: pain level controlled Vital Signs Assessment: post-procedure vital signs reviewed and stable Respiratory status: spontaneous breathing Cardiovascular status: blood pressure returned to baseline Postop Assessment: able to ambulate Anesthetic complications: no Comments: Anesthesia consult requested by Cousins MD.  Pt has history of lower back/sacral pain from previous delivery requiring PT.  Pt interviewed.  Pt. Dressed and states she has been walking.  C/O generalized low back pain.  Denies numbness or tingling in legs.  Back examined and noted to have needle size blue mark at site of epidural placement. Signs and symptoms of epidural hematoma discussed with patient and patient reassured no apparent epdural complications noted at this time.  Pt. Informed some back pain common after epidural and usually recedes in day or two. Pt instructed to notify anesthesia if experiences new onset of numbness or tingling in her legs.     No complications documented.  Last Vitals:  Vitals:   05/07/20 2025 05/08/20 0528  BP: 125/85 131/83  Pulse: 72 70  Resp:  18  Temp: 36.9 C 36.5 C  SpO2: 99% 98%    Last Pain:  Vitals:   05/08/20 0802  TempSrc:   PainSc: 0-No pain   Pain Goal: Patients Stated Pain Goal: 1 (05/08/20 0528)                 Merrilyn Puma

## 2020-05-08 NOTE — Progress Notes (Signed)
This RN placed indwelling catheter and attached leg bag per request from Dr. Cherly Hensen. Pt educated on home care; to include emptying and cleaning. Pt verbalized and demonstrated understanding.

## 2020-05-08 NOTE — Discharge Instructions (Signed)
Per postpartum booklet given 

## 2020-05-08 NOTE — Discharge Summary (Signed)
Postpartum Discharge Summary       Patient Name: Patricia Everett DOB: 02-02-1986 MRN: [id number]  Date of admission: 05/06/2020 Delivery date:05/06/2020  Delivering provider: Tiana Loft E  Date of discharge: 05/08/2020  Admitting diagnosis: Encounter for induction of labor [Z34.90] Polyhydramnios Intrauterine pregnancy: [redacted]w[redacted]d    Secondary diagnosis:  Principal Problem:   PP care - NSVB 6/9 Active Problems:   Encounter for induction of labor  Additional problems: none    Discharge diagnosis: Term Pregnancy Delivered and polyhydramnios, urinary incontinence                                              Post partum procedures:none Augmentation: N/A Complications: None  Hospital course: Induction of Labor With Vaginal Delivery   34y.o. yo G3P3003 at 393w2das admitted to the hospital 05/06/2020 for induction of labor.  Indication for induction: polyhydramnios.  Patient had an uncomplicated labor course as follows: Membrane Rupture Time/Date: 8:50 PM ,05/06/2020   Delivery Method:Vaginal, Spontaneous  Episiotomy: None  Lacerations:  None  Details of delivery can be found in separate delivery note.  Patient had a routine postpartum course except just prior to discharge pt had loss of bladder function . Was c/o back with epidural site and was seen by CRNA. Pt had leg bag placement per request rather than wait to see if issue would recur. Patient is discharged home. 05/08/2020  Newborn Data: Birth date:05/06/2020  Birth time:11:33 PM  Gender:Female  Living status:Living  Apgars:8 ,9  Weight:3890 g   Magnesium Sulfate received: No BMZ received: No Rhophylac:N/A MMR:No T-DaP:Given prenatally Flu: No Transfusion:No  Physical exam  Vitals:   05/07/20 0955 05/07/20 1413 05/07/20 2025 05/08/20 0528  BP: 121/82 119/83 125/85 131/83  Pulse: 71 70 72 70  Resp: '18 18  18  ' Temp: 98.1 F (36.7 C) 98.2 F (36.8 C) 98.4 F (36.9 C) 97.7 F (36.5 C)  TempSrc: Oral Oral Oral  Oral  SpO2: 100% 100% 99% 98%  Weight:      Height:       General: alert, cooperative and no distress Lochia: appropriate Uterine Fundus: firm Incision: N/A DVT Evaluation: No evidence of DVT seen on physical exam. Labs: Lab Results  Component Value Date   WBC 11.5 (H) 05/07/2020   HGB 10.0 (L) 05/07/2020   HCT 31.0 (L) 05/07/2020   MCV 85.9 05/07/2020   PLT 143 (L) 05/07/2020   CMP Latest Ref Rng & Units 05/06/2020  Glucose 70 - 99 mg/dL 89  BUN 6 - 20 mg/dL <5(L)  Creatinine 0.44 - 1.00 mg/dL 0.63  Sodium 135 - 145 mmol/L 138  Potassium 3.5 - 5.1 mmol/L 3.1(L)  Chloride 98 - 111 mmol/L 107  CO2 22 - 32 mmol/L 21(L)  Calcium 8.9 - 10.3 mg/dL 8.8(L)  Total Protein 6.5 - 8.1 g/dL 6.4(L)  Total Bilirubin 0.3 - 1.2 mg/dL 0.7  Alkaline Phos 38 - 126 U/L 135(H)  AST 15 - 41 U/L 18  ALT 0 - 44 U/L 12   Edinburgh Score: Edinburgh Postnatal Depression Scale Screening Tool 05/08/2020  I have been able to laugh and see the funny side of things. 0  I have looked forward with enjoyment to things. 0  I have blamed myself unnecessarily when things went wrong. 0  I have been anxious or worried for no good reason.  0  I have felt scared or panicky for no good reason. 0  Things have been getting on top of me. 0  I have been so unhappy that I have had difficulty sleeping. 0  I have felt sad or miserable. 0  I have been so unhappy that I have been crying. 0  The thought of harming myself has occurred to me. 0  Edinburgh Postnatal Depression Scale Total 0      After visit meds:     Discharge home in stable condition Infant Feeding: Bottle Infant Disposition:home with mother Discharge instruction: per After Visit Summary and Postpartum booklet. Activity: Advance as tolerated. Pelvic rest for 6 weeks.  Diet: routine diet Anticipated Birth Control: IUD Postpartum Appointment:2-3 days Additional Postpartum F/U: Foley cath removal Future Appointments:No future appointments. Follow  up Visit:  Follow-up Information    Servando Salina, MD Follow up in 6 week(s).   Specialty: Obstetrics and Gynecology Contact information: [place] [id number] [place], MD Follow up in 6 week(s).   Specialty: Obstetrics and Gynecology Contact information: [place] [address] [phone]                   05/08/2020 Marvene Staff, MD

## 2020-05-10 ENCOUNTER — Inpatient Hospital Stay (HOSPITAL_COMMUNITY)
Admission: AD | Admit: 2020-05-10 | Discharge: 2020-05-15 | Disposition: A | Discharge status: Home / Self Care | Payer: BC Managed Care – PPO | Source: Ambulatory Visit | Attending: Obstetrics and Gynecology | Admitting: Obstetrics and Gynecology

## 2020-05-10 ENCOUNTER — Encounter (HOSPITAL_COMMUNITY): Payer: Self-pay | Admitting: Obstetrics and Gynecology

## 2020-05-10 ENCOUNTER — Other Ambulatory Visit: Payer: Self-pay

## 2020-05-10 DIAGNOSIS — O1495 Unspecified pre-eclampsia, complicating the puerperium: Secondary | ICD-10-CM | POA: Diagnosis present

## 2020-05-10 LAB — COMPREHENSIVE METABOLIC PANEL
ALT: 22 U/L (ref 0–44)
AST: 38 U/L (ref 15–41)
Albumin: 2.4 g/dL — ABNORMAL LOW (ref 3.5–5.0)
Alkaline Phosphatase: 88 U/L (ref 38–126)
Anion gap: 9 (ref 5–15)
BUN: 10 mg/dL (ref 6–20)
CO2: 22 mmol/L (ref 22–32)
Calcium: 8.7 mg/dL — ABNORMAL LOW (ref 8.9–10.3)
Chloride: 109 mmol/L (ref 98–111)
Creatinine, Ser: 0.75 mg/dL (ref 0.44–1.00)
GFR calc Af Amer: >60 mL/min (ref >60)
GFR calc non Af Amer: >60 mL/min (ref >60)
Glucose, Bld: 95 mg/dL (ref 70–99)
Potassium: 3.6 mmol/L (ref 3.5–5.1)
Sodium: 140 mmol/L (ref 135–145)
Total Bilirubin: 0.3 mg/dL (ref 0.3–1.2)
Total Protein: 5.6 g/dL — ABNORMAL LOW (ref 6.5–8.1)

## 2020-05-10 LAB — CBC
HCT: 32.9 % — ABNORMAL LOW (ref 36.0–46.0)
Hemoglobin: 10.5 g/dL — ABNORMAL LOW (ref 12.0–15.0)
MCH: 28.2 pg (ref 26.0–34.0)
MCHC: 31.9 g/dL (ref 30.0–36.0)
MCV: 88.2 fL (ref 80.0–100.0)
Platelets: 211 10*3/uL (ref 150–400)
RBC: 3.73 MIL/uL — ABNORMAL LOW (ref 3.87–5.11)
RDW: 15.2 % (ref 11.5–15.5)
WBC: 8.3 10*3/uL (ref 4.0–10.5)
nRBC: 0 % (ref 0.0–0.2)

## 2020-05-10 MED ORDER — LABETALOL HCL 5 MG/ML IV SOLN
20.0000 mg | INTRAVENOUS | Status: DC | PRN
  Administered 2020-05-13 – 2020-05-14 (×3): 20 mg via INTRAVENOUS
  Filled 2020-05-10 (×3): qty 4

## 2020-05-10 MED ORDER — LABETALOL HCL 5 MG/ML IV SOLN
20.0000 mg | INTRAVENOUS | Status: DC | PRN
  Administered 2020-05-10: 20 mg via INTRAVENOUS
  Filled 2020-05-10: qty 4

## 2020-05-10 MED ORDER — MAGNESIUM SULFATE BOLUS VIA INFUSION
4.0000 g | Freq: Once | INTRAVENOUS | Status: AC
  Administered 2020-05-11: 4 g via INTRAVENOUS
  Filled 2020-05-10: qty 1000

## 2020-05-10 MED ORDER — MAGNESIUM SULFATE 40 GM/1000ML IV SOLN
2.0000 g/h | INTRAVENOUS | Status: AC
  Administered 2020-05-11: 2 g/h via INTRAVENOUS
  Filled 2020-05-10: qty 1000

## 2020-05-10 MED ORDER — HYDRALAZINE HCL 20 MG/ML IJ SOLN
10.0000 mg | INTRAMUSCULAR | Status: DC | PRN
  Administered 2020-05-11: 10 mg via INTRAVENOUS
  Filled 2020-05-10 (×2): qty 1

## 2020-05-10 MED ORDER — HYDRALAZINE HCL 20 MG/ML IJ SOLN
10.0000 mg | INTRAMUSCULAR | Status: DC | PRN
  Filled 2020-05-10: qty 1

## 2020-05-10 MED ORDER — LABETALOL HCL 5 MG/ML IV SOLN
40.0000 mg | INTRAVENOUS | Status: DC | PRN
  Administered 2020-05-10: 40 mg via INTRAVENOUS
  Filled 2020-05-10: qty 8

## 2020-05-10 MED ORDER — LABETALOL HCL 5 MG/ML IV SOLN
40.0000 mg | INTRAVENOUS | Status: DC | PRN
  Administered 2020-05-13 – 2020-05-14 (×3): 40 mg via INTRAVENOUS
  Filled 2020-05-10 (×3): qty 8

## 2020-05-10 MED ORDER — LABETALOL HCL 5 MG/ML IV SOLN
80.0000 mg | INTRAVENOUS | Status: DC | PRN
  Administered 2020-05-10: 80 mg via INTRAVENOUS
  Filled 2020-05-10: qty 16

## 2020-05-10 MED ORDER — LABETALOL HCL 5 MG/ML IV SOLN
80.0000 mg | INTRAVENOUS | Status: DC | PRN
  Administered 2020-05-13 – 2020-05-14 (×3): 80 mg via INTRAVENOUS
  Filled 2020-05-10 (×3): qty 16

## 2020-05-10 NOTE — MAU Note (Signed)
Patient reports to MAU for bleeding from her indwelling catheter. She states that she is bleeding from where the catheter is inserted.

## 2020-05-10 NOTE — MAU Provider Note (Signed)
History     CSN: [id number]  Arrival date and time: 05/10/20 2150   First Provider Initiated Contact with Patient 05/10/20 2236      Chief Complaint  Patient presents with  . Hypertension   HPI Patricia Everett is a 34 y.o. G3P3003 postpartum from a vaginal delivery on 6/9 who presents for evaluation of her indwelling catheter. She states she feels like it is bleeding from the catheter and not her vagina. She has intermittent pain at her urethra.   On arrival to MAU, she was found to have severe range BPs. She reports a HA that she rates a 4/10 and intermittent visual changes. Denies RUQ pain. She states she had some elevated BPs in the office prior to delivery and immediately PP but her labs were normal.   OB History as of 05/08/2020    Gravida  3   Para  3   Term  3   Preterm      AB      Living  3     SAB      TAB      Ectopic      Multiple  0   Live Births  3           Past Medical History:  Diagnosis Date  . Family history of adverse reaction to anesthesia    sister hard to wake up  . No pertinent past medical history   . Pain in back   . Pain in hip     Past Surgical History:  Procedure Laterality Date  . NO PAST SURGERIES    . POLYPECTOMY  01/2015    Family History  Problem Relation Age of Onset  . Migraines Sister   . Heart disease Maternal Grandmother   . Heart disease Maternal Grandfather   . Neuropathy Paternal Grandmother   . Ovarian cancer Paternal Grandmother   . Other Neg Hx     Social History   Tobacco Use  . Smoking status: Never Smoker  . Smokeless tobacco: Never Used  Vaping Use  . Vaping Use: Never used  Substance Use Topics  . Alcohol use: No  . Drug use: No    Allergies:  Allergies  Allergen Reactions  . Aspirin     Patient unsure; mother and sister have anaphylaxis reaction Patient is able to take ibuprofen without problems per CNM (05/07/20)    Medications Prior to Admission  Medication Sig Dispense  Refill Last Dose  . cephALEXin (KEFLEX) 250 MG capsule Take 1 capsule (250 mg total) by mouth 4 (four) times daily for 5 days. 20 capsule 0 05/10/2020 at Unknown time  . ibuprofen (ADVIL) 600 MG tablet Take 1 tablet (600 mg total) by mouth every 6 (six) hours as needed. 30 tablet 5 05/10/2020 at 1200  . iron polysaccharides (NIFEREX) 150 MG capsule Take 1 capsule (150 mg total) by mouth daily. 30 capsule 11 05/09/2020 at Unknown time  . omeprazole (PRILOSEC) 20 MG capsule Take 20 mg by mouth daily.   05/09/2020 at Unknown time  . Diclofenac Potassium (CAMBIA) 50 MG PACK Take 50 mg by mouth as needed (for migraine; max 1 packet per day). 9 each 3   . ketorolac (TORADOL) 10 MG tablet Take 10 mg by mouth every 6 (six) hours as needed.     . rizatriptan (MAXALT-MLT) 10 MG disintegrating tablet Take 1 tablet (10 mg total) by mouth as needed for migraine. May repeat in 2 hours if needed 9  tablet 11     Review of Systems  Constitutional: Negative.  Negative for fatigue and fever.  HENT: Negative.   Respiratory: Negative.  Negative for shortness of breath.   Cardiovascular: Negative.  Negative for chest pain.  Gastrointestinal: Negative.  Negative for abdominal pain, constipation, diarrhea, nausea and vomiting.  Genitourinary: Positive for difficulty urinating, dysuria, hematuria and vaginal bleeding. Negative for vaginal discharge.  Neurological: Positive for headaches. Negative for dizziness.   Physical Exam   Blood pressure (!) 181/85, pulse (!) 52, temperature 98.1 F (36.7 C), temperature source Oral, resp. rate 18, weight 77.3 kg, SpO2 100 %, unknown if currently breastfeeding. Patient Vitals for the past 24 hrs:  BP Temp Temp src Pulse Resp SpO2 Weight  05/11/20 0000 (!) 152/92 -- -- 66 -- 97 % --  05/10/20 2346 (!) 156/92 -- -- 65 -- -- --  05/10/20 2343 (!) 155/83 -- -- 65 -- -- --  05/10/20 2331 (!) 166/94 -- -- 68 -- -- --  05/10/20 2323 (!) 160/86 -- -- (!) 59 -- -- --  05/10/20 2315  (!) 161/86 -- -- (!) 59 -- 99 % --  05/10/20 2301 (!) 166/86 -- -- (!) 52 -- -- --  05/10/20 2300 (!) 162/89 -- -- (!) 54 -- 98 % --  05/10/20 2246 (!) 170/92 -- -- (!) 50 -- -- --  05/10/20 2231 (!) 186/97 -- -- (!) 51 -- -- --  05/10/20 2226 (!) 171/96 -- -- (!) 56 -- -- --  05/10/20 2211 (!) 181/85 98.1 F (36.7 C) Oral (!) 52 18 100 % 77.3 kg   Physical Exam  Nursing note and vitals reviewed. Constitutional: She is oriented to person, place, and time. She appears well-developed. No distress.  HENT:  Head: Normocephalic.  Eyes: Pupils are equal, round, and reactive to light.  Cardiovascular: Normal rate, regular rhythm and normal heart sounds.  Respiratory: Effort normal and breath sounds normal. No respiratory distress.  GI: Soft. Bowel sounds are normal. She exhibits no distension. There is no abdominal tenderness.  Genitourinary:    Genitourinary Comments: Hemostatic abrasions noted around urethra, right upper labia laceration noted. No bleeding from urethra appreciated on exam   Neurological: She is alert and oriented to person, place, and time.  Skin: Skin is warm and dry.  Psychiatric: Her behavior is normal. Judgment and thought content normal.    MAU Course  Procedures Results for orders placed or performed during the hospital encounter of 05/10/20 (from the past 24 hour(s))  CBC     Status: Abnormal   Collection Time: 05/10/20 10:42 PM  Result Value Ref Range   WBC 8.3 4.0 - 10.5 K/uL   RBC 3.73 (L) 3.87 - 5.11 MIL/uL   Hemoglobin 10.5 (L) 12.0 - 15.0 g/dL   HCT 06.2 (L) 36 - 46 %   MCV 88.2 80.0 - 100.0 fL   MCH 28.2 26.0 - 34.0 pg   MCHC 31.9 30.0 - 36.0 g/dL   RDW 37.6 28.3 - 15.1 %   Platelets 211 150 - 400 K/uL   nRBC 0.0 0.0 - 0.2 %  Comprehensive metabolic panel     Status: Abnormal   Collection Time: 05/10/20 10:42 PM  Result Value Ref Range   Sodium 140 135 - 145 mmol/L   Potassium 3.6 3.5 - 5.1 mmol/L   Chloride 109 98 - 111 mmol/L   CO2 22 22 - 32  mmol/L   Glucose, Bld 95 70 - 99 mg/dL   BUN  10 6 - 20 mg/dL   Creatinine, Ser 3.14 0.44 - 1.00 mg/dL   Calcium 8.7 (L) 8.9 - 10.3 mg/dL   Total Protein 5.6 (L) 6.5 - 8.1 g/dL   Albumin 2.4 (L) 3.5 - 5.0 g/dL   AST 38 15 - 41 U/L   ALT 22 0 - 44 U/L   Alkaline Phosphatase 88 38 - 126 U/L   Total Bilirubin 0.3 0.3 - 1.2 mg/dL   GFR calc non Af Amer >60 >60 mL/min   GFR calc Af Amer >60 >60 mL/min   Anion gap 9 5 - 15   MDM CBC, CMP Preeclampsia protocol- labetalol x3 given  Consulted with Dr. Despina Hidden regarding persistent severe range BPs despite treatment and HA- recommends admission to [facility] for magnesium  Dr. Billy Coast called and report given. Will admit to [facility] for magnesium  Assessment and Plan  Postpartum Preeclampsia  -Admit to [facility] -Mag 4g bolus/2g hr -Care turned over to MD  Rolm Bookbinder CNM 05/10/2020, 10:36 PM

## 2020-05-11 ENCOUNTER — Encounter (HOSPITAL_COMMUNITY): Payer: Self-pay | Admitting: Obstetrics and Gynecology

## 2020-05-11 ENCOUNTER — Observation Stay (HOSPITAL_COMMUNITY): Payer: BC Managed Care – PPO

## 2020-05-11 DIAGNOSIS — O1415 Severe pre-eclampsia, complicating the puerperium: Secondary | ICD-10-CM | POA: Diagnosis not present

## 2020-05-11 DIAGNOSIS — M546 Pain in thoracic spine: Secondary | ICD-10-CM | POA: Diagnosis not present

## 2020-05-11 DIAGNOSIS — O9089 Other complications of the puerperium, not elsewhere classified: Secondary | ICD-10-CM | POA: Diagnosis not present

## 2020-05-11 DIAGNOSIS — R32 Unspecified urinary incontinence: Secondary | ICD-10-CM | POA: Diagnosis not present

## 2020-05-11 DIAGNOSIS — M545 Low back pain: Secondary | ICD-10-CM | POA: Diagnosis not present

## 2020-05-11 LAB — CBC WITH DIFFERENTIAL/PLATELET
Abs Immature Granulocytes: 0.04 10*3/uL (ref 0.00–0.07)
Basophils Absolute: 0 10*3/uL (ref 0.0–0.1)
Basophils Relative: 0 %
Eosinophils Absolute: 0.1 10*3/uL (ref 0.0–0.5)
Eosinophils Relative: 1 %
HCT: 33.7 % — ABNORMAL LOW (ref 36.0–46.0)
Hemoglobin: 11 g/dL — ABNORMAL LOW (ref 12.0–15.0)
Immature Granulocytes: 1 %
Lymphocytes Relative: 14 %
Lymphs Abs: 1.2 10*3/uL (ref 0.7–4.0)
MCH: 28.7 pg (ref 26.0–34.0)
MCHC: 32.6 g/dL (ref 30.0–36.0)
MCV: 88 fL (ref 80.0–100.0)
Monocytes Absolute: 0.5 10*3/uL (ref 0.1–1.0)
Monocytes Relative: 6 %
Neutro Abs: 6.6 10*3/uL (ref 1.7–7.7)
Neutrophils Relative %: 78 %
Platelets: 207 10*3/uL (ref 150–400)
RBC: 3.83 MIL/uL — ABNORMAL LOW (ref 3.87–5.11)
RDW: 15.2 % (ref 11.5–15.5)
WBC: 8.5 10*3/uL (ref 4.0–10.5)
nRBC: 0 % (ref 0.0–0.2)

## 2020-05-11 LAB — COMPREHENSIVE METABOLIC PANEL
ALT: 20 U/L (ref 0–44)
AST: 28 U/L (ref 15–41)
Albumin: 2.4 g/dL — ABNORMAL LOW (ref 3.5–5.0)
Alkaline Phosphatase: 88 U/L (ref 38–126)
Anion gap: 10 (ref 5–15)
BUN: 6 mg/dL (ref 6–20)
CO2: 24 mmol/L (ref 22–32)
Calcium: 7.1 mg/dL — ABNORMAL LOW (ref 8.9–10.3)
Chloride: 108 mmol/L (ref 98–111)
Creatinine, Ser: 0.67 mg/dL (ref 0.44–1.00)
GFR calc Af Amer: >60 mL/min (ref >60)
GFR calc non Af Amer: >60 mL/min (ref >60)
Glucose, Bld: 116 mg/dL — ABNORMAL HIGH (ref 70–99)
Potassium: 3.3 mmol/L — ABNORMAL LOW (ref 3.5–5.1)
Sodium: 142 mmol/L (ref 135–145)
Total Bilirubin: 0.3 mg/dL (ref 0.3–1.2)
Total Protein: 5.5 g/dL — ABNORMAL LOW (ref 6.5–8.1)

## 2020-05-11 LAB — SURGICAL PATHOLOGY

## 2020-05-11 MED ORDER — LABETALOL HCL 100 MG PO TABS
100.0000 mg | ORAL_TABLET | Freq: Three times a day (TID) | ORAL | Status: DC
  Administered 2020-05-11 (×3): 100 mg via ORAL
  Filled 2020-05-11 (×4): qty 1

## 2020-05-11 MED ORDER — IBUPROFEN 600 MG PO TABS
600.0000 mg | ORAL_TABLET | Freq: Four times a day (QID) | ORAL | Status: DC | PRN
  Administered 2020-05-12 – 2020-05-15 (×5): 600 mg via ORAL
  Filled 2020-05-11 (×5): qty 1

## 2020-05-11 MED ORDER — HYDROXYZINE HCL 50 MG PO TABS
25.0000 mg | ORAL_TABLET | Freq: Three times a day (TID) | ORAL | Status: DC | PRN
  Administered 2020-05-12 – 2020-05-14 (×3): 25 mg via ORAL
  Filled 2020-05-11 (×3): qty 1

## 2020-05-11 MED ORDER — LACTATED RINGERS IV SOLN: INTRAVENOUS | Status: DC

## 2020-05-11 MED ORDER — ACETAMINOPHEN 500 MG PO TABS
1000.0000 mg | ORAL_TABLET | Freq: Four times a day (QID) | ORAL | Status: DC | PRN
  Administered 2020-05-11 – 2020-05-15 (×8): 1000 mg via ORAL
  Filled 2020-05-11 (×10): qty 2

## 2020-05-11 MED ORDER — MAGNESIUM SULFATE 40 GM/1000ML IV SOLN
2.0000 g/h | INTRAVENOUS | Status: AC
  Administered 2020-05-11: 2 g/h via INTRAVENOUS
  Filled 2020-05-11: qty 1000

## 2020-05-11 MED ORDER — LABETALOL HCL 200 MG PO TABS
200.0000 mg | ORAL_TABLET | Freq: Three times a day (TID) | ORAL | Status: DC
  Administered 2020-05-11 – 2020-05-13 (×6): 200 mg via ORAL
  Filled 2020-05-11 (×6): qty 1

## 2020-05-11 MED ORDER — GADOBUTROL 1 MMOL/ML IV SOLN
7.5000 mL | Freq: Once | INTRAVENOUS | Status: AC | PRN
  Administered 2020-05-12: 7.5 mL via INTRAVENOUS

## 2020-05-11 NOTE — Progress Notes (Addendum)
No h/a, vision changes, ruq pain Some numbness upper left thigh, able to walk/no weakness; leaking urine and can't stop stream; feels that she has to void, but then when she moves she leaks and then cannot stop voiding; no dysuria; last void about 2 hrs ago, has been trying to avoid moving Day 1 s/p svd (epidural) denies any problems with urination; then day 2 on day of d/c voiding w/o any sensation of needing to void and went home with leg bag; now s/p 12 hrs magnesium sulfate and foley catheter was removed this am; pt feeling more of a sensation that she needs to void but also cannot control her urine; while in room and pt used bed pan - did not seem aware at all times as to when she was voiding  Reports h/o left leg weakness in a prior pregnancy where she required a wheelchair - no etiology found, resolved on its own and no issues since (about 7 yrs ago)  Temp:  [97.8 F (36.6 C)-98.2 F (36.8 C)] 97.8 F (36.6 C) (06/14 1156) Pulse Rate:  [50-88] 78 (06/14 1156) Resp:  [16-19] 18 (06/14 1156) BP: (109-186)/(57-97) 130/77 (06/14 1156) SpO2:  [93 %-100 %] 99 % (06/14 1156) Weight:  [77.3 kg] 77.3 kg (06/14 0101) Bps nml to mild range today   Intake/Output Summary (Last 24 hours) at 05/11/2020 1719 Last data filed at 05/11/2020 1551 Gross per 24 hour  Intake 2503.89 ml  Output 4125 ml  Net -1621.11 ml     A&ox3 nml respirations LE; no edema/nt Sve: decreased pelvic floor tone, pt reports feeling my fingers but unable to tighten muscles   Results for orders placed or performed during the hospital encounter of 05/10/20 (from the past 24 hour(s))  CBC     Status: Abnormal   Collection Time: 05/10/20 10:42 PM  Result Value Ref Range   WBC 8.3 4.0 - 10.5 K/uL   RBC 3.73 (L) 3.87 - 5.11 MIL/uL   Hemoglobin 10.5 (L) 12.0 - 15.0 g/dL   HCT 32.9 (L) 36 - 46 %   MCV 88.2 80.0 - 100.0 fL   MCH 28.2 26.0 - 34.0 pg   MCHC 31.9 30.0 - 36.0 g/dL   RDW 15.2 11.5 - 15.5 %   Platelets 211  150 - 400 K/uL   nRBC 0.0 0.0 - 0.2 %  Comprehensive metabolic panel     Status: Abnormal   Collection Time: 05/10/20 10:42 PM  Result Value Ref Range   Sodium 140 135 - 145 mmol/L   Potassium 3.6 3.5 - 5.1 mmol/L   Chloride 109 98 - 111 mmol/L   CO2 22 22 - 32 mmol/L   Glucose, Bld 95 70 - 99 mg/dL   BUN 10 6 - 20 mg/dL   Creatinine, Ser 0.75 0.44 - 1.00 mg/dL   Calcium 8.7 (L) 8.9 - 10.3 mg/dL   Total Protein 5.6 (L) 6.5 - 8.1 g/dL   Albumin 2.4 (L) 3.5 - 5.0 g/dL   AST 38 15 - 41 U/L   ALT 22 0 - 44 U/L   Alkaline Phosphatase 88 38 - 126 U/L   Total Bilirubin 0.3 0.3 - 1.2 mg/dL   GFR calc non Af Amer >60 >60 mL/min   GFR calc Af Amer >60 >60 mL/min   Anion gap 9 5 - 15  CBC with Differential/Platelet     Status: Abnormal   Collection Time: 05/11/20 11:51 AM  Result Value Ref Range   WBC 8.5 4.0 -  10.5 K/uL   RBC 3.83 (L) 3.87 - 5.11 MIL/uL   Hemoglobin 11.0 (L) 12.0 - 15.0 g/dL   HCT 24.5 (L) 36 - 46 %   MCV 88.0 80.0 - 100.0 fL   MCH 28.7 26.0 - 34.0 pg   MCHC 32.6 30.0 - 36.0 g/dL   RDW 80.9 98.3 - 38.2 %   Platelets 207 150 - 400 K/uL   nRBC 0.0 0.0 - 0.2 %   Neutrophils Relative % 78 %   Neutro Abs 6.6 1.7 - 7.7 K/uL   Lymphocytes Relative 14 %   Lymphs Abs 1.2 0.7 - 4.0 K/uL   Monocytes Relative 6 %   Monocytes Absolute 0.5 0 - 1 K/uL   Eosinophils Relative 1 %   Eosinophils Absolute 0.1 0 - 0 K/uL   Basophils Relative 0 %   Basophils Absolute 0.0 0 - 0 K/uL   Immature Granulocytes 1 %   Abs Immature Granulocytes 0.04 0.00 - 0.07 K/uL  Comprehensive metabolic panel     Status: Abnormal   Collection Time: 05/11/20 11:51 AM  Result Value Ref Range   Sodium 142 135 - 145 mmol/L   Potassium 3.3 (L) 3.5 - 5.1 mmol/L   Chloride 108 98 - 111 mmol/L   CO2 24 22 - 32 mmol/L   Glucose, Bld 116 (H) 70 - 99 mg/dL   BUN 6 6 - 20 mg/dL   Creatinine, Ser 5.05 0.44 - 1.00 mg/dL   Calcium 7.1 (L) 8.9 - 10.3 mg/dL   Total Protein 5.5 (L) 6.5 - 8.1 g/dL   Albumin 2.4  (L) 3.5 - 5.0 g/dL   AST 28 15 - 41 U/L   ALT 20 0 - 44 U/L   Alkaline Phosphatase 88 38 - 126 U/L   Total Bilirubin 0.3 0.3 - 1.2 mg/dL   GFR calc non Af Amer >60 >60 mL/min   GFR calc Af Amer >60 >60 mL/min   Anion gap 10 5 - 15   A/P: ppd 5 s/p svd, readmit for severe pp pre-eclampsia 1. Severe pre-e: s/p 12 hr magnesium sulfate and diuresing well; on labetalol 100mg  po tid and doing well 2. Urinary incontinence: pelvic floor dysfunction; decreased ability to sense urge to void and when voiding; discussed need to void more frequently and encourage q 30 min; still diuresing d/t magnesium sulfate and did just void when evaluating pt (after 2 hrs), will also start bedside commode to assist patient though discourage her from just sitting on the chair when not voiding; after discussion with nursing, appears that patient did void quite a bit before realizing she was voiding; will consult neurology

## 2020-05-11 NOTE — Progress Notes (Signed)
Feeling better No headaches or CP or SOB BP 132/75 (BP Location: Right Arm)   Pulse 64   Temp 98.2 F (36.8 C) (Oral)   Resp 18   Ht 5\' 5"  (1.651 m)   Wt 77.3 kg   SpO2 100%   BMI 28.36 kg/m   Will rpt labs and do 12 hr Mag course if nl . Labetalol 100mg  tid.

## 2020-05-11 NOTE — Progress Notes (Addendum)
Dr Amado Nash notified at 1950 of headache and blood pressure of 161/88 taken at 1939. 15 minute recheck not performed as patient taken to MRI.  CNM notified of severe range blood pressure of 176/93 at 2130. Orders given to hold IV labetalol and increase PO labetalol to 200mg  tid. 200 mg PO labetalol administered for 2200 dose.  Patient reports headache, +1 DTRs upon examination, no clonus, no epigastric pain, no vision changes.  , RN   Notified of findings above.  I was in the OR with Dr. Quincy Simmonds.  Dr. Amado Nash consulted and gave orders to increase Labetalol 200mg  TID.   Amado Nash, CNM

## 2020-05-11 NOTE — MAU Provider Note (Signed)
History and Physical exam     CSN: [id number]    Chief Complaint  Patient presents with   Hypertension   Hypertension Associated symptoms include headaches. Pertinent negatives include no chest pain or shortness of breath.   Patricia Everett is a 34 y.o. G3P3003 postpartum from a vaginal delivery on 6/9 who presents for evaluation of her indwelling catheter. She states she feels like it is bleeding from the catheter and not her vagina. She has intermittent pain at her urethra. Denies HA or CP or SOB upon presentation. Reported nl BPs at home.  On arrival to MAU, she was found to have severe range BPs.  Denies RUQ pain. She states she had some elevated BPs in the office prior to delivery and immediately PP but her labs were normal.   OB History as of 05/08/2020    Gravida  3   Para  3   Term  3   Preterm      AB      Living  3     SAB      TAB      Ectopic      Multiple  0   Live Births  3           Past Medical History:  Diagnosis Date   Family history of adverse reaction to anesthesia    sister hard to wake up   No pertinent past medical history    Pain in back    Pain in hip     Past Surgical History:  Procedure Laterality Date   NO PAST SURGERIES     POLYPECTOMY  01/2015    Family History  Problem Relation Age of Onset   Migraines Sister    Heart disease Maternal Grandmother    Heart disease Maternal Grandfather    Neuropathy Paternal Grandmother    Ovarian cancer Paternal Grandmother    Other Neg Hx     Social History   Tobacco Use   Smoking status: Never Smoker   Smokeless tobacco: Never Used  Vaping Use   Vaping Use: Never used  Substance Use Topics   Alcohol use: No   Drug use: No    Allergies:  Allergies  Allergen Reactions   Aspirin     Patient unsure; mother and sister have anaphylaxis reaction Patient is able to take ibuprofen without problems per CNM (05/07/20)    Medications Prior to Admission   Medication Sig Dispense Refill Last Dose   cephALEXin (KEFLEX) 250 MG capsule Take 1 capsule (250 mg total) by mouth 4 (four) times daily for 5 days. 20 capsule 0 05/10/2020 at Unknown time   ibuprofen (ADVIL) 600 MG tablet Take 1 tablet (600 mg total) by mouth every 6 (six) hours as needed. 30 tablet 5 05/10/2020 at 1200   iron polysaccharides (NIFEREX) 150 MG capsule Take 1 capsule (150 mg total) by mouth daily. 30 capsule 11 05/09/2020 at Unknown time   omeprazole (PRILOSEC) 20 MG capsule Take 20 mg by mouth daily.   05/09/2020 at Unknown time   Diclofenac Potassium (CAMBIA) 50 MG PACK Take 50 mg by mouth as needed (for migraine; max 1 packet per day). 9 each 3    ketorolac (TORADOL) 10 MG tablet Take 10 mg by mouth every 6 (six) hours as needed.      rizatriptan (MAXALT-MLT) 10 MG disintegrating tablet Take 1 tablet (10 mg total) by mouth as needed for migraine. May repeat in 2 hours if needed 9  tablet 11     Review of Systems  Constitutional: Negative.  Negative for fatigue and fever.  HENT: Negative.   Respiratory: Negative.  Negative for shortness of breath.   Cardiovascular: Negative.  Negative for chest pain.  Gastrointestinal: Negative.  Negative for abdominal pain, constipation, diarrhea, nausea and vomiting.  Genitourinary: Positive for difficulty urinating, dysuria, hematuria and vaginal bleeding. Negative for vaginal discharge.  Neurological: Positive for headaches. Negative for dizziness.   Physical Exam   Blood pressure (!) 142/88, pulse 61, temperature 98.1 F (36.7 C), temperature source Oral, resp. rate 18, weight 77.3 kg, SpO2 99 %, unknown if currently breastfeeding. Patient Vitals for the past 24 hrs:  BP Temp Temp src Pulse Resp SpO2 Weight  05/11/20 0101 (!) 142/88 98.1 F (36.7 C) Oral 61 18 99 % --  05/11/20 0045 138/79 -- -- 69 -- 97 % --  05/11/20 0036 136/83 98 F (36.7 C) Oral 74 18 98 % --  05/11/20 0031 (!) 154/86 -- -- 65 -- -- --  05/11/20 0015  (!) 154/86 98.1 F (36.7 C) Oral 74 18 96 % --  05/11/20 0000 (!) 152/92 -- -- 66 -- 97 % --  05/10/20 2346 (!) 156/92 -- -- 65 -- -- --  05/10/20 2343 (!) 155/83 -- -- 65 -- -- --  05/10/20 2331 (!) 166/94 -- -- 68 -- -- --  05/10/20 2323 (!) 160/86 -- -- (!) 59 -- -- --  05/10/20 2315 (!) 161/86 -- -- (!) 59 -- 99 % --  05/10/20 2301 (!) 166/86 -- -- (!) 52 -- -- --  05/10/20 2300 (!) 162/89 -- -- (!) 54 -- 98 % --  05/10/20 2246 (!) 170/92 -- -- (!) 50 -- -- --  05/10/20 2231 (!) 186/97 -- -- (!) 51 -- -- --  05/10/20 2226 (!) 171/96 -- -- (!) 56 -- -- --  05/10/20 2211 (!) 181/85 98.1 F (36.7 C) Oral (!) 52 18 100 % 77.3 kg   Physical Exam  Nursing note and vitals reviewed. Constitutional: She is oriented to person, place, and time. She appears well-developed. No distress.  HENT:  Head: Normocephalic.  Eyes: Pupils are equal, round, and reactive to light.  Cardiovascular: Normal rate, regular rhythm and normal heart sounds.  Respiratory: Effort normal and breath sounds normal. No respiratory distress.  GI: Soft. Bowel sounds are normal. She exhibits no distension. There is no abdominal tenderness.  Genitourinary:    Genitourinary Comments: Hemostatic abrasions noted around urethra, right upper labia laceration noted. No bleeding from urethra appreciated on exam   Neurological: She is alert and oriented to person, place, and time.  Skin: Skin is warm and dry.  Psychiatric: Her behavior is normal. Judgment and thought content normal.  Ext: no cords, DTRs 2+  MAU Course  Procedures Results for orders placed or performed during the hospital encounter of 05/10/20 (from the past 24 hour(s))  CBC     Status: Abnormal   Collection Time: 05/10/20 10:42 PM  Result Value Ref Range   WBC 8.3 4.0 - 10.5 K/uL   RBC 3.73 (L) 3.87 - 5.11 MIL/uL   Hemoglobin 10.5 (L) 12.0 - 15.0 g/dL   HCT 88.4 (L) 36 - 46 %   MCV 88.2 80.0 - 100.0 fL   MCH 28.2 26.0 - 34.0 pg   MCHC 31.9 30.0 - 36.0  g/dL   RDW 16.6 06.3 - 01.6 %   Platelets 211 150 - 400 K/uL   nRBC 0.0 0.0 -  0.2 %  Comprehensive metabolic panel     Status: Abnormal   Collection Time: 05/10/20 10:42 PM  Result Value Ref Range   Sodium 140 135 - 145 mmol/L   Potassium 3.6 3.5 - 5.1 mmol/L   Chloride 109 98 - 111 mmol/L   CO2 22 22 - 32 mmol/L   Glucose, Bld 95 70 - 99 mg/dL   BUN 10 6 - 20 mg/dL   Creatinine, Ser 0.45 0.44 - 1.00 mg/dL   Calcium 8.7 (L) 8.9 - 10.3 mg/dL   Total Protein 5.6 (L) 6.5 - 8.1 g/dL   Albumin 2.4 (L) 3.5 - 5.0 g/dL   AST 38 15 - 41 U/L   ALT 22 0 - 44 U/L   Alkaline Phosphatase 88 38 - 126 U/L   Total Bilirubin 0.3 0.3 - 1.2 mg/dL   GFR calc non Af Amer >60 >60 mL/min   GFR calc Af Amer >60 >60 mL/min   Anion gap 9 5 - 15      Assessment and Plan  Postpartum Preeclampsia- stable labs. No neuro sxs. Due to severe range BPs will proceed with admission  -Admit to [facility] -Mag 4g bolus/2g hr I/Os    Harm Jou J CNM 05/11/2020, 1:11 AM

## 2020-05-11 NOTE — Consult Note (Signed)
Reason for Consult: Urinary incontinence Referring Physician: Dr. Cephas Darby I Allard is an 34 y.o. female.  HPI: She is s/p vaginal delivery with epidural anesthesia 5 days ago.  About 2 days after delivery, she developed urinary incontinence on daily basis.  She has no sensation that she has to go urinate.  There is no leg weakness or numbness.  She does have some low back pain.  About 7 years ago, she developed left leg weakness after vaginal delivery, but no etiology was identified.  It recovered naturally.  She has no upper extremity symptoms.  No gait issues..  No prior history of optic neuritis or myelitis.  When she came in for urinary incontinence, she was found to have pre-eclampsia as well and has received IV magnesium here.  BP is well controlled currently.    Past Medical History:  Diagnosis Date  . Family history of adverse reaction to anesthesia    sister hard to wake up  . No pertinent past medical history   . Pain in back   . Pain in hip     Past Surgical History:  Procedure Laterality Date  . NO PAST SURGERIES    . POLYPECTOMY  01/2015    Family History  Problem Relation Age of Onset  . Migraines Sister   . Heart disease Maternal Grandmother   . Heart disease Maternal Grandfather   . Neuropathy Paternal Grandmother   . Ovarian cancer Paternal Grandmother   . Other Neg Hx     Social History:  reports that she has never smoked. She has never used smokeless tobacco. She reports that she does not drink alcohol and does not use drugs.  Allergies:  Allergies  Allergen Reactions  . Aspirin     Patient unsure; mother and sister have anaphylaxis reaction Patient is able to take ibuprofen without problems per CNM (05/07/20)    Prior to Admission medications   Medication Sig Start Date End Date Taking? Authorizing Provider  cephALEXin (KEFLEX) 250 MG capsule Take 1 capsule (250 mg total) by mouth 4 (four) times daily for 5 days. 05/08/20 05/13/20 Yes Maxie Better, MD  ibuprofen (ADVIL) 600 MG tablet Take 1 tablet (600 mg total) by mouth every 6 (six) hours as needed. 05/08/20  Yes Maxie Better, MD  iron polysaccharides (NIFEREX) 150 MG capsule Take 1 capsule (150 mg total) by mouth daily. 05/09/20  Yes Maxie Better, MD  omeprazole (PRILOSEC) 20 MG capsule Take 20 mg by mouth daily.   Yes [provider]  Diclofenac Potassium (CAMBIA) 50 MG PACK Take 50 mg by mouth as needed (for migraine; max 1 packet per day). 04/09/15   Penumalli, Glenford Bayley, MD  ketorolac (TORADOL) 10 MG tablet Take 10 mg by mouth every 6 (six) hours as needed.    [provider]  rizatriptan (MAXALT-MLT) 10 MG disintegrating tablet Take 1 tablet (10 mg total) by mouth as needed for migraine. May repeat in 2 hours if needed 04/09/15   Suanne Marker, MD    Medications:  Scheduled: . labetalol  100 mg Oral TID    Results for orders placed or performed during the hospital encounter of 05/10/20 (from the past 48 hour(s))  CBC     Status: Abnormal   Collection Time: 05/10/20 10:42 PM  Result Value Ref Range   WBC 8.3 4.0 - 10.5 K/uL   RBC 3.73 (L) 3.87 - 5.11 MIL/uL   Hemoglobin 10.5 (L) 12.0 - 15.0 g/dL   HCT  32.9 (L) 36 - 46 %   MCV 88.2 80.0 - 100.0 fL   MCH 28.2 26.0 - 34.0 pg   MCHC 31.9 30.0 - 36.0 g/dL   RDW 73.7 10.6 - 26.9 %   Platelets 211 150 - 400 K/uL   nRBC 0.0 0.0 - 0.2 %    Comment: Performed at [facility], [address] [place], [place] [address]  Comprehensive metabolic panel     Status: Abnormal   Collection Time: 05/10/20 10:42 PM  Result Value Ref Range   Sodium 140 135 - 145 mmol/L   Potassium 3.6 3.5 - 5.1 mmol/L   Chloride 109 98 - 111 mmol/L   CO2 22 22 - 32 mmol/L   Glucose, Bld 95 70 - 99 mg/dL    Comment: Glucose reference range applies only to samples taken after fasting for at least 8 hours.   BUN 10 6 - 20 mg/dL   Creatinine, Ser 2.70 0.44 - 1.00 mg/dL   Calcium 8.7 (L) 8.9 - 10.3 mg/dL    Total Protein 5.6 (L) 6.5 - 8.1 g/dL   Albumin 2.4 (L) 3.5 - 5.0 g/dL   AST 38 15 - 41 U/L   ALT 22 0 - 44 U/L   Alkaline Phosphatase 88 38 - 126 U/L   Total Bilirubin 0.3 0.3 - 1.2 mg/dL   GFR calc non Af Amer >60 >60 mL/min   GFR calc Af Amer >60 >60 mL/min   Anion gap 9 5 - 15    Comment: Performed at [facility], [address] [place], [place] [address]  CBC with Differential/Platelet     Status: Abnormal   Collection Time: 05/11/20 11:51 AM  Result Value Ref Range   WBC 8.5 4.0 - 10.5 K/uL   RBC 3.83 (L) 3.87 - 5.11 MIL/uL   Hemoglobin 11.0 (L) 12.0 - 15.0 g/dL   HCT 38.1 (L) 36 - 46 %   MCV 88.0 80.0 - 100.0 fL   MCH 28.7 26.0 - 34.0 pg   MCHC 32.6 30.0 - 36.0 g/dL   RDW 82.9 93.7 - 16.9 %   Platelets 207 150 - 400 K/uL   nRBC 0.0 0.0 - 0.2 %   Neutrophils Relative % 78 %   Neutro Abs 6.6 1.7 - 7.7 K/uL   Lymphocytes Relative 14 %   Lymphs Abs 1.2 0.7 - 4.0 K/uL   Monocytes Relative 6 %   Monocytes Absolute 0.5 0 - 1 K/uL   Eosinophils Relative 1 %   Eosinophils Absolute 0.1 0 - 0 K/uL   Basophils Relative 0 %   Basophils Absolute 0.0 0 - 0 K/uL   Immature Granulocytes 1 %   Abs Immature Granulocytes 0.04 0.00 - 0.07 K/uL    Comment: Performed at [facility], [address], [place], [place] [address]  Comprehensive metabolic panel     Status: Abnormal   Collection Time: 05/11/20 11:51 AM  Result Value Ref Range   Sodium 142 135 - 145 mmol/L   Potassium 3.3 (L) 3.5 - 5.1 mmol/L   Chloride 108 98 - 111 mmol/L   CO2 24 22 - 32 mmol/L   Glucose, Bld 116 (H) 70 - 99 mg/dL    Comment: Glucose reference range applies only to samples taken after fasting for at least 8 hours.   BUN 6 6 - 20 mg/dL   Creatinine, Ser 8.10 0.44 - 1.00 mg/dL   Calcium 7.1 (L) 8.9 - 10.3  mg/dL   Total Protein 5.5 (L) 6.5 - 8.1 g/dL   Albumin 2.4 (L) 3.5 - 5.0 g/dL   AST 28 15 - 41 U/L   ALT 20 0 - 44 U/L   Alkaline Phosphatase 88 38 - 126 U/L   Total Bilirubin 0.3 0.3 -  1.2 mg/dL   GFR calc non Af Amer >60 >60 mL/min   GFR calc Af Amer >60 >60 mL/min   Anion gap 10 5 - 15    Comment: Performed at [place] [address] [place], [place] [address]    No results found.  ROS Blood pressure (!) 147/94, pulse 84, temperature 98.6 F (37 C), temperature source Oral, resp. rate 18, height 5\' 5"  (1.651 m), weight 77.3 kg, SpO2 100 %, unknown if currently breastfeeding. Neurologic Examination:  Awake, alert, fully oriented. PERL.  EOMI.  Face symmetrical.  Tongue midline. Strength 5/5 BUE and BLE. Coord- intact FTN and HTS bilaterally. Sensory - intact in the legs and arms, but cannot fully confirm genital or peri-anal numbness. ?equivocal babinski signs No Hoffman's. Reflexes +2 ankles, patella, biceps, triceps, brachioradialis. Gait- normal, including tandem.   Assessment/Plan:  Vaginal births can sometimes produce compression on the lumbosacral plexus and lead to incontinence, but is often associated with weakness and numbness in the legs.  Cauda equina is more severe presentation as well.  A thoracic myelitis is unlikely as well.  Having such an isolated urinary incontinence is very unusual but may be related to partial involvement of the lower plexus only.    I will do MRI Thoracic and Lumbosacral spine and pelvis to see if we can identify an etiology.    Rogue Jury, MD 05/11/2020, 6:56 PM

## 2020-05-12 ENCOUNTER — Inpatient Hospital Stay (HOSPITAL_COMMUNITY): Payer: BC Managed Care – PPO

## 2020-05-12 DIAGNOSIS — R32 Unspecified urinary incontinence: Secondary | ICD-10-CM | POA: Diagnosis not present

## 2020-05-12 DIAGNOSIS — O9089 Other complications of the puerperium, not elsewhere classified: Secondary | ICD-10-CM | POA: Diagnosis not present

## 2020-05-12 DIAGNOSIS — O1415 Severe pre-eclampsia, complicating the puerperium: Secondary | ICD-10-CM | POA: Diagnosis not present

## 2020-05-12 LAB — COMPREHENSIVE METABOLIC PANEL
ALT: 19 U/L (ref 0–44)
AST: 21 U/L (ref 15–41)
Albumin: 2.4 g/dL — ABNORMAL LOW (ref 3.5–5.0)
Alkaline Phosphatase: 86 U/L (ref 38–126)
Anion gap: 10 (ref 5–15)
BUN: 5 mg/dL — ABNORMAL LOW (ref 6–20)
CO2: 24 mmol/L (ref 22–32)
Calcium: 7.7 mg/dL — ABNORMAL LOW (ref 8.9–10.3)
Chloride: 107 mmol/L (ref 98–111)
Creatinine, Ser: 0.63 mg/dL (ref 0.44–1.00)
GFR calc Af Amer: >60 mL/min (ref >60)
GFR calc non Af Amer: >60 mL/min (ref >60)
Glucose, Bld: 99 mg/dL (ref 70–99)
Potassium: 3.1 mmol/L — ABNORMAL LOW (ref 3.5–5.1)
Sodium: 141 mmol/L (ref 135–145)
Total Bilirubin: 0.2 mg/dL — ABNORMAL LOW (ref 0.3–1.2)
Total Protein: 5.8 g/dL — ABNORMAL LOW (ref 6.5–8.1)

## 2020-05-12 LAB — CBC
HCT: 34.8 % — ABNORMAL LOW (ref 36.0–46.0)
Hemoglobin: 11.1 g/dL — ABNORMAL LOW (ref 12.0–15.0)
MCH: 28 pg (ref 26.0–34.0)
MCHC: 31.9 g/dL (ref 30.0–36.0)
MCV: 87.7 fL (ref 80.0–100.0)
Platelets: 226 10*3/uL (ref 150–400)
RBC: 3.97 MIL/uL (ref 3.87–5.11)
RDW: 15.2 % (ref 11.5–15.5)
WBC: 6.3 10*3/uL (ref 4.0–10.5)
nRBC: 0 % (ref 0.0–0.2)

## 2020-05-12 MED ORDER — HYDRALAZINE HCL 20 MG/ML IJ SOLN
5.0000 mg | Freq: Once | INTRAMUSCULAR | Status: AC
  Administered 2020-05-12: 5 mg via INTRAVENOUS

## 2020-05-12 MED ORDER — FUROSEMIDE 20 MG PO TABS
20.0000 mg | ORAL_TABLET | Freq: Once | ORAL | Status: AC
  Administered 2020-05-12: 20 mg via ORAL
  Filled 2020-05-12: qty 1

## 2020-05-12 NOTE — Progress Notes (Signed)
No h/a, vision changes, ruq pain; now able to void q hr and using bathroom toilet (started with bedside yesterday) - only occasionally leaks some drops -- much improved from yesterday No sob/cp/n,v; nml lochia  Patient Vitals for the past 24 hrs:  BP Temp Temp src Pulse Resp SpO2  05/12/20 0831 (!) 148/91 98 F (36.7 C) Oral 76 18 98 %  05/12/20 0750 (!) 150/94 -- -- 71 -- --  05/12/20 0731 (!) 169/91 98 F (36.7 C) Oral 61 20 99 %  05/12/20 0626 (!) 151/79 98.1 F (36.7 C) Oral (!) 59 18 98 %  05/12/20 0532 (!) 145/79 98.1 F (36.7 C) Oral (!) 57 18 98 %  05/12/20 0420 (!) 153/91 97.8 F (36.6 C) Oral 62 -- 98 %  05/12/20 0300 130/75 98.2 F (36.8 C) Oral (!) 59 -- 99 %  05/12/20 0209 140/69 98.4 F (36.9 C) Oral 63 17 100 %  05/12/20 0103 (!) 153/75 -- -- (!) 59 -- --  05/12/20 0045 (!) 164/85 98.7 F (37.1 C) Oral (!) 56 18 --  05/11/20 2342 (!) 145/79 98.2 F (36.8 C) Oral 65 18 100 %  05/11/20 2256 (!) 161/78 98.3 F (36.8 C) Oral (!) 53 18 --  05/11/20 2154 (!) 171/89 -- -- 66 -- --  05/11/20 2147 131/74 98.3 F (36.8 C) Oral 68 17 99 %  05/11/20 2130 (!) 176/93 -- -- 62 -- --  05/11/20 1941 -- -- -- -- -- 99 %  05/11/20 1939 (!) 161/88 98.6 F (37 C) Oral 69 18 100 %  05/11/20 1551 (!) 147/94 98.6 F (37 C) Oral 84 18 100 %  05/11/20 1156 130/77 97.8 F (36.6 C) Oral 78 18 99 %    Intake/Output Summary (Last 24 hours) at 05/12/2020 1129 Last data filed at 05/12/2020 1110 Gross per 24 hour  Intake 2526.48 ml  Output 4350 ml  Net -1823.52 ml     A&ox3 rrr ctab Abd: soft, nt, nd; fundus firm and below umb LE: no edema, nt bilat; 2+ dtr  Results for orders placed or performed during the hospital encounter of 05/10/20 (from the past 24 hour(s))  CBC with Differential/Platelet     Status: Abnormal   Collection Time: 05/11/20 11:51 AM  Result Value Ref Range   WBC 8.5 4.0 - 10.5 K/uL   RBC 3.83 (L) 3.87 - 5.11 MIL/uL   Hemoglobin 11.0 (L) 12.0 - 15.0 g/dL    HCT 33.7 (L) 36 - 46 %   MCV 88.0 80.0 - 100.0 fL   MCH 28.7 26.0 - 34.0 pg   MCHC 32.6 30.0 - 36.0 g/dL   RDW 15.2 11.5 - 15.5 %   Platelets 207 150 - 400 K/uL   nRBC 0.0 0.0 - 0.2 %   Neutrophils Relative % 78 %   Neutro Abs 6.6 1.7 - 7.7 K/uL   Lymphocytes Relative 14 %   Lymphs Abs 1.2 0.7 - 4.0 K/uL   Monocytes Relative 6 %   Monocytes Absolute 0.5 0 - 1 K/uL   Eosinophils Relative 1 %   Eosinophils Absolute 0.1 0 - 0 K/uL   Basophils Relative 0 %   Basophils Absolute 0.0 0 - 0 K/uL   Immature Granulocytes 1 %   Abs Immature Granulocytes 0.04 0.00 - 0.07 K/uL  Comprehensive metabolic panel     Status: Abnormal   Collection Time: 05/11/20 11:51 AM  Result Value Ref Range   Sodium 142 135 - 145 mmol/L  Potassium 3.3 (L) 3.5 - 5.1 mmol/L   Chloride 108 98 - 111 mmol/L   CO2 24 22 - 32 mmol/L   Glucose, Bld 116 (H) 70 - 99 mg/dL   BUN 6 6 - 20 mg/dL   Creatinine, Ser 6.60 0.44 - 1.00 mg/dL   Calcium 7.1 (L) 8.9 - 10.3 mg/dL   Total Protein 5.5 (L) 6.5 - 8.1 g/dL   Albumin 2.4 (L) 3.5 - 5.0 g/dL   AST 28 15 - 41 U/L   ALT 20 0 - 44 U/L   Alkaline Phosphatase 88 38 - 126 U/L   Total Bilirubin 0.3 0.3 - 1.2 mg/dL   GFR calc non Af Amer >60 >60 mL/min   GFR calc Af Amer >60 >60 mL/min   Anion gap 10 5 - 15  Comprehensive metabolic panel     Status: Abnormal   Collection Time: 05/12/20  5:14 AM  Result Value Ref Range   Sodium 141 135 - 145 mmol/L   Potassium 3.1 (L) 3.5 - 5.1 mmol/L   Chloride 107 98 - 111 mmol/L   CO2 24 22 - 32 mmol/L   Glucose, Bld 99 70 - 99 mg/dL   BUN 5 (L) 6 - 20 mg/dL   Creatinine, Ser 6.30 0.44 - 1.00 mg/dL   Calcium 7.7 (L) 8.9 - 10.3 mg/dL   Total Protein 5.8 (L) 6.5 - 8.1 g/dL   Albumin 2.4 (L) 3.5 - 5.0 g/dL   AST 21 15 - 41 U/L   ALT 19 0 - 44 U/L   Alkaline Phosphatase 86 38 - 126 U/L   Total Bilirubin 0.2 (L) 0.3 - 1.2 mg/dL   GFR calc non Af Amer >60 >60 mL/min   GFR calc Af Amer >60 >60 mL/min   Anion gap 10 5 - 15  CBC      Status: Abnormal   Collection Time: 05/12/20  5:14 AM  Result Value Ref Range   WBC 6.3 4.0 - 10.5 K/uL   RBC 3.97 3.87 - 5.11 MIL/uL   Hemoglobin 11.1 (L) 12.0 - 15.0 g/dL   HCT 16.0 (L) 36 - 46 %   MCV 87.7 80.0 - 100.0 fL   MCH 28.0 26.0 - 34.0 pg   MCHC 31.9 30.0 - 36.0 g/dL   RDW 10.9 32.3 - 55.7 %   Platelets 226 150 - 400 K/uL   nRBC 0.0 0.0 - 0.2 %   nml MRI thoracic and lumpar spine; trace bilat pleural effusion, slightly larger left  A/P:  Pp severe pre-e; urinary incontinence 1. Severe pre-e: bps severe range last night so increased labetalol dosing and restarted magnesium sulfate to complete 24 hr; labs repeated and still wnl; has had good UOP; plan to monitor bps today and plan d/c home tomorrow pending no changes with urinary incontinence/neuro workup; small pl effusion bilat, plan lasix x1 - pt asymptomatic 2. Urinary incontinence - pt improving and has more sensation over last 24 hrs; able to hold urine for an hour with minimal leakage; now able to stop stream; nml thoracic/lumbar spine mri but pt didn't tolerate for pelvic mri - encouraged pt to complete this workup today; neurology following, possible compression of lumbosacral plexus; contin q hr voiding, anticipate continued improvement; wait for neuro assessment/plan pending mri findings; pt does agree to proceed with pelvic mri today

## 2020-05-12 NOTE — Plan of Care (Signed)
MRI T- and L-spine unremarkable for etiology for current presentation of isolated urinary incontinence. Will follow MR pelvis when completed. -- Milon Dikes, MD [facility] Pager: [phone] If 7pm to 7am, please call on call as listed on AMION.

## 2020-05-12 NOTE — Plan of Care (Signed)
MRI Pelvis completed IMPRESSION: 1. No MR evidence of lumbar plexopathy. No findings to explain patient's symptoms. 2. No acute findings. 3. Postpartum appearance of the uterus.   Recs: Supportive care per primary team No acute neurological or neurosurgical intervention at this time. If incontinence does not improve in time, can obtain outpatient neurology consultation. Please call with questions as needed   -- Milon Dikes, MD [facility] Pager: [phone] If 7pm to 7am, please call on call as listed on AMION.

## 2020-05-13 DIAGNOSIS — O9089 Other complications of the puerperium, not elsewhere classified: Secondary | ICD-10-CM | POA: Diagnosis not present

## 2020-05-13 DIAGNOSIS — O1415 Severe pre-eclampsia, complicating the puerperium: Secondary | ICD-10-CM | POA: Diagnosis not present

## 2020-05-13 DIAGNOSIS — R32 Unspecified urinary incontinence: Secondary | ICD-10-CM | POA: Diagnosis not present

## 2020-05-13 MED ORDER — LABETALOL HCL 200 MG PO TABS
400.0000 mg | ORAL_TABLET | Freq: Three times a day (TID) | ORAL | Status: DC
  Administered 2020-05-13 – 2020-05-14 (×4): 400 mg via ORAL
  Filled 2020-05-13 (×5): qty 2

## 2020-05-13 MED ORDER — HYDRALAZINE HCL 50 MG PO TABS
50.0000 mg | ORAL_TABLET | Freq: Four times a day (QID) | ORAL | Status: DC
  Administered 2020-05-13 – 2020-05-15 (×8): 50 mg via ORAL
  Filled 2020-05-13 (×9): qty 1

## 2020-05-13 NOTE — Progress Notes (Signed)
HD #3 S/p Magnesium sulfate  For severe pp preeclampsia S: denies ha, visual changes or epigastric pain Some low back pain . Heat on back Voiding better O:  Patient Vitals for the past 24 hrs:  BP Temp Temp src Pulse Resp SpO2  05/13/20 0831 (!) 168/90 -- -- (!) 57 -- --  05/13/20 0818 (!) 170/91 -- -- (!) 58 -- --  05/13/20 0811 (!) 177/102 -- -- 86 -- --  05/13/20 0806 (!) 184/91 -- -- 61 -- --  05/13/20 0801 (!) 174/93 -- -- (!) 58 -- --  05/13/20 0754 (!) 179/91 -- -- (!) 57 -- --  05/13/20 0740 (!) 160/89 97.9 F (36.6 C) Oral (!) 56 20 99 %  05/13/20 0347 (!) 145/75 98.2 F (36.8 C) Oral (!) 58 20 99 %  05/13/20 0000 140/80 -- -- 68 18 --  05/12/20 2231 136/83 98.5 F (36.9 C) Oral 64 18 99 %  05/12/20 1717 (!) 150/86 -- -- 68 -- --  05/12/20 1648 (!) 169/90 -- -- (!) 55 -- --  05/12/20 1632 (!) 172/94 -- -- 67 -- --  05/12/20 1227 -- -- -- -- 18 98 %  05/12/20 1225 (!) 152/102 -- -- 75 -- --  lungs clear to A  Cor RRR abd uterus firm 2FB below umb Pad no lochia  extr no edema MR THORACIC SPINE WO CONTRAST  Result Date: 05/11/2020 CLINICAL DATA:  Initial evaluation for new onset urinary incontinence, mild lower back pain. Patient 5 days postpartum. Received epidural with labor and delivery. EXAM: MRI THORACIC AND LUMBAR SPINE WITHOUT CONTRAST TECHNIQUE: Multiplanar and multiecho pulse sequences of the thoracic and lumbar spine were obtained without intravenous contrast. COMPARISON:  None available. FINDINGS: MRI THORACIC SPINE FINDINGS Alignment: Vertebral bodies normally aligned with preservation of the normal thoracic kyphosis. No listhesis or subluxation. Vertebrae: Vertebral body height well maintained without evidence for acute or chronic fracture. Bone marrow signal intensity within normal limits. No discrete or worrisome osseous lesions. No abnormal marrow edema. Cord: Signal intensity within the thoracic spinal cord is normal. Normal cord caliber and morphology. No  abnormal epidural collections or other complication status post recent epidural injection. Paraspinal and other soft tissues: Paraspinous soft tissues within normal limits. Trace layering bilateral pleural effusions, slightly larger on the left. Visualized lungs are otherwise grossly clear. Visualized visceral structures otherwise unremarkable. Disc levels: No significant disc pathology seen within the thoracic spine. Intervertebral discs are well hydrated with preserved disc height. No disc bulge or focal disc herniation. No significant facet disease. No canal or neural foraminal stenosis. No impingement. MRI LUMBAR SPINE FINDINGS Segmentation: Standard. Lowest well-formed disc space labeled the L5-S1 level. Alignment: Physiologic with preservation of the normal lumbar lordosis. No listhesis or subluxation. Vertebrae: Vertebral body height well maintained without evidence for acute or chronic fracture. Bone marrow signal intensity within normal limits. No discrete or worrisome osseous lesions. No abnormal marrow edema. Conus medullaris: Extends to the L1 level and appears normal. No epidural collection or other complication related to recent epidural spinal injection is seen. Paraspinal and other soft tissues: Paraspinous soft tissues within normal limits. Subcentimeter simple cyst noted within the interpolar right kidney. Visualized uterus is enlarged and globular in appearance, consistent with history of recent labor and delivery. Visualized visceral structures otherwise unremarkable. Disc levels: No significant disc pathology seen within the lumbar spine. Intervertebral discs are well hydrated with preserved disc height. No disc bulge or focal disc herniation. No significant facet disease. No canal  or neural foraminal stenosis. No neural impingement. IMPRESSION: 1. Normal MRI of the thoracic and lumbar spine. No abnormal epidural collection or other complication status post recent epidural spinal injection. 2.  Trace layering bilateral pleural effusions, slightly larger on the left. Electronically Signed   By: Rise Mu M.D.   On: 05/11/2020 21:50   MR LUMBAR SPINE WO CONTRAST  Result Date: 05/11/2020 CLINICAL DATA:  Initial evaluation for new onset urinary incontinence, mild lower back pain. Patient 5 days postpartum. Received epidural with labor and delivery. EXAM: MRI THORACIC AND LUMBAR SPINE WITHOUT CONTRAST TECHNIQUE: Multiplanar and multiecho pulse sequences of the thoracic and lumbar spine were obtained without intravenous contrast. COMPARISON:  None available. FINDINGS: MRI THORACIC SPINE FINDINGS Alignment: Vertebral bodies normally aligned with preservation of the normal thoracic kyphosis. No listhesis or subluxation. Vertebrae: Vertebral body height well maintained without evidence for acute or chronic fracture. Bone marrow signal intensity within normal limits. No discrete or worrisome osseous lesions. No abnormal marrow edema. Cord: Signal intensity within the thoracic spinal cord is normal. Normal cord caliber and morphology. No abnormal epidural collections or other complication status post recent epidural injection. Paraspinal and other soft tissues: Paraspinous soft tissues within normal limits. Trace layering bilateral pleural effusions, slightly larger on the left. Visualized lungs are otherwise grossly clear. Visualized visceral structures otherwise unremarkable. Disc levels: No significant disc pathology seen within the thoracic spine. Intervertebral discs are well hydrated with preserved disc height. No disc bulge or focal disc herniation. No significant facet disease. No canal or neural foraminal stenosis. No impingement. MRI LUMBAR SPINE FINDINGS Segmentation: Standard. Lowest well-formed disc space labeled the L5-S1 level. Alignment: Physiologic with preservation of the normal lumbar lordosis. No listhesis or subluxation. Vertebrae: Vertebral body height well maintained without  evidence for acute or chronic fracture. Bone marrow signal intensity within normal limits. No discrete or worrisome osseous lesions. No abnormal marrow edema. Conus medullaris: Extends to the L1 level and appears normal. No epidural collection or other complication related to recent epidural spinal injection is seen. Paraspinal and other soft tissues: Paraspinous soft tissues within normal limits. Subcentimeter simple cyst noted within the interpolar right kidney. Visualized uterus is enlarged and globular in appearance, consistent with history of recent labor and delivery. Visualized visceral structures otherwise unremarkable. Disc levels: No significant disc pathology seen within the lumbar spine. Intervertebral discs are well hydrated with preserved disc height. No disc bulge or focal disc herniation. No significant facet disease. No canal or neural foraminal stenosis. No neural impingement. IMPRESSION: 1. Normal MRI of the thoracic and lumbar spine. No abnormal epidural collection or other complication status post recent epidural spinal injection. 2. Trace layering bilateral pleural effusions, slightly larger on the left. Electronically Signed   By: Rise Mu M.D.   On: 05/11/2020 21:50   MR PELVIS W WO CONTRAST  Result Date: 05/12/2020 CLINICAL DATA:  Urinary incontinence. Status post vaginal delivery with epidural anesthesia 5 days ago. Concern for lumbar plexopathy EXAM: MRI PELVIS WITHOUT AND WITH CONTRAST TECHNIQUE: Multiplanar multisequence MR imaging of the pelvis was performed both before and after administration of intravenous contrast. CONTRAST:  7.48mL GADAVIST GADOBUTROL 1 MMOL/ML IV SOLN COMPARISON:  Lumbar spine MRI 05/11/2020 FINDINGS: Lumbar plexus: Normal size and signal intensity of the nerve roots of the lumbosacral plexus. No focal nerve root enlargement. No perineural edema. Muscles and tendons Musculature of the pelvis appear normal without edema, atrophy, or fatty  infiltration to suggest denervation changes. Tendinous structures of the bilateral hips  appear grossly intact. Bones Visualized marrow structures of the pelvis are unremarkable. No fracture or marrow replacing lesion. No femoral head avascular necrosis. Well-hydrated intervertebral disc spaces of the lumbar spine. Joints The bilateral hip and sacroiliac joints appear within normal limits. No joint effusion. Other findings Postpartum appearance of the uterus. IMPRESSION: 1. No MR evidence of lumbar plexopathy. No findings to explain patient's symptoms. 2. No acute findings. 3. Postpartum appearance of the uterus. Electronically Signed   By: Davina Poke D.O.   On: 05/12/2020 16:32   IMP: severe pp preeclampsia on labetalol and hydralazine ( this am) Loss of bladder control improved. neurol w/u neg so far P) cont inpt for better BP control. Started on hydralazine oral. Recommend f/u cardiology to r/o 2nd causes of HTN if remains HTN 6 wk pp

## 2020-05-13 NOTE — Progress Notes (Signed)
Htn, needing IV push meds.  Pt notes felt bp rising as HA came on, needed IV labetalol 20,40,80 mg prior to improvmeent in bp. Pt notes no HA, no CP at this time. Admits to poor sleep since delivery. Finally has restful night last night. Happy with normal voiding at this time. No prior h/o severe anxiety. Pt did try Vistaril this evening and thinks that is helping.  PE: Vitals:   05/13/20 1813 05/13/20 1925 05/13/20 1926 05/13/20 2003  BP: (!) 154/91 129/83  135/84  Pulse: 64 80    Resp:  18  18  Temp:  98.2 F (36.8 C)  97.7 F (36.5 C)  TempSrc:  Oral  Oral  SpO2:   99% 99%  Weight:      Height:       Gen: well appearing, sitting up blow drying hair CV RRR Pulm CTAB Abd; soft, nT, ND LE: socks on, no pain, no edema  A/P: PP PEC with nl labs, severe range bps and h/o urinary dysfunction - bladder function improving on own -bp, recent severe range, now improved, suspect anxiety component, cont vistaril as needed, hydralizine started this am, labetalol doubled this pm. Cont to watch bps closely.  Lendon Colonel 05/13/2020 8:31 PM

## 2020-05-13 NOTE — Progress Notes (Signed)
Dr. Cherly Hensen notified of severe range BPs this morning.  RN as given up to 80mg  IV labetalol.  50mg  hydralazine PO q6 ordered

## 2020-05-14 DIAGNOSIS — O1415 Severe pre-eclampsia, complicating the puerperium: Secondary | ICD-10-CM | POA: Diagnosis not present

## 2020-05-14 DIAGNOSIS — R32 Unspecified urinary incontinence: Secondary | ICD-10-CM | POA: Diagnosis not present

## 2020-05-14 DIAGNOSIS — O9089 Other complications of the puerperium, not elsewhere classified: Secondary | ICD-10-CM | POA: Diagnosis not present

## 2020-05-14 MED ORDER — HYDROXYZINE HCL 50 MG PO TABS: 25.0000 mg | ORAL_TABLET | Freq: Three times a day (TID) | ORAL | Status: DC

## 2020-05-14 MED ORDER — HYDROXYZINE HCL 50 MG PO TABS
25.0000 mg | ORAL_TABLET | Freq: Three times a day (TID) | ORAL | Status: DC
  Administered 2020-05-14 – 2020-05-15 (×3): 25 mg via ORAL
  Filled 2020-05-14 (×3): qty 1

## 2020-05-14 MED ORDER — POTASSIUM CHLORIDE CRYS ER 20 MEQ PO TBCR
10.0000 meq | EXTENDED_RELEASE_TABLET | Freq: Once | ORAL | Status: AC
  Administered 2020-05-14: 10 meq via ORAL
  Filled 2020-05-14: qty 1

## 2020-05-14 MED ORDER — NIFEDIPINE ER OSMOTIC RELEASE 30 MG PO TB24
30.0000 mg | ORAL_TABLET | Freq: Every day | ORAL | Status: DC
  Administered 2020-05-14 – 2020-05-15 (×2): 30 mg via ORAL
  Filled 2020-05-14 (×2): qty 1

## 2020-05-14 NOTE — Progress Notes (Signed)
Spoke w RN with noted BPs in chart. She pushed IV meds and is awaiting next BP read.  Switch Hydroxyzine to scheduled TID dosing instead of PRN Will discuss how to bring baby in house

## 2020-05-14 NOTE — Progress Notes (Signed)
Subjective: Patient notes resting well overnight, normal appetite and bowel movements.  Voiding has returned to mostly normal although she did have some urgency after sleeping all night.  Patient reports excellent sleep with 1 dose of Vistaril last night at 10 PM.  No headache this morning, no vision change.  Patient notes no chest pain no shortness of breath.  Patient reports feeling well  Objective: Vitals:   05/13/20 2328 05/14/20 0358 05/14/20 0431 05/14/20 0438  BP: (!) 158/86   (!) 145/69  Pulse: 71   65  Resp: 17 17  18   Temp: 98 F (36.7 C) 98.6 F (37 C)  98.6 F (37 C)  TempSrc: Oral Oral  Oral  SpO2: 99% 97%  98%  Weight:   76.8 kg   Height:       General: Well-appearing laying in bed, no distress Cardiovascular: Regular rate and rhythm Pulmonary clear to auscultation bilaterally Abdomen: Soft, fundus below the umbilicus, no right upper quadrant pain GU: Deferred Lower extremity: No appreciable edema, 1+ DTR, no clonus  CBC    Component Value Date/Time   WBC 6.3 05/12/2020 0514   RBC 3.97 05/12/2020 0514   HGB 11.1 (L) 05/12/2020 0514   HCT 34.8 (L) 05/12/2020 0514   PLT 226 05/12/2020 0514   MCV 87.7 05/12/2020 0514   MCH 28.0 05/12/2020 0514   MCHC 31.9 05/12/2020 0514   RDW 15.2 05/12/2020 0514   LYMPHSABS 1.2 05/11/2020 1151   MONOABS 0.5 05/11/2020 1151   EOSABS 0.1 05/11/2020 1151   BASOSABS 0.0 05/11/2020 1151   CMP     Component Value Date/Time   NA 141 05/12/2020 0514   K 3.1 (L) 05/12/2020 0514   CL 107 05/12/2020 0514   CO2 24 05/12/2020 0514   GLUCOSE 99 05/12/2020 0514   BUN 5 (L) 05/12/2020 0514   CREATININE 0.63 05/12/2020 0514   CALCIUM 7.7 (L) 05/12/2020 0514   PROT 5.8 (L) 05/12/2020 0514   ALBUMIN 2.4 (L) 05/12/2020 0514   AST 21 05/12/2020 0514   ALT 19 05/12/2020 0514   ALKPHOS 86 05/12/2020 0514   BILITOT 0.2 (L) 05/12/2020 0514   GFRNONAA >60 05/12/2020 0514   GFRAA >60 05/12/2020 0514    Assessment and plan: Postpartum  preeclampsia with significantly elevated blood pressures needing additional IV push meds last night.  Patient needed 2040 then 80 mg of IV labetalol.  I suspect there is a significant anxiety component to her hypertension.  Patient agrees with this.  With the addition of Vistaril last night her pressures have been better controlled.  She was also started on 10 mg p.o. hydralazine every 6 hours and last night after her elevated blood pressures her labetalol was changed from 200 to 400 mg every 8 hours.  This regimen seems to be working well for her over the last 12 hours we will continue to watch through the day today and consider discharge later today or tomorrow.   -Patient also anxious about getting home to the baby.  As she is not exclusively breast-feeding there are apparent hospital restrictions not allowing her baby to stay with her. -Will give oral magnesium for replacement.  2041 05/14/2020 9:34 AM

## 2020-05-14 NOTE — Progress Notes (Signed)
Pt remains without symptoms, no HA/ vision changes/ RUQ pain/ CP/ SOB Pushed Labetaol 20 mg IV again this evening.   BP (!) 167/89 (BP Location: Right Arm)    Pulse (!) 59    Temp 98.1 F (36.7 C) (Oral)    Resp 18    Ht 5\' 5"  (1.651 m)    Wt 76.8 kg Comment: standing scale    SpO2 98%    BMI 28.16 kg/m   Patient Vitals for the past 24 hrs:  BP Temp Temp src Pulse Resp SpO2 Weight  05/14/20 1928 (!) 167/89 98.1 F (36.7 C) Oral (!) 59 18 -- --  05/14/20 1819 (!) 174/94 -- -- 63 -- -- --  05/14/20 1737 (!) 173/98 -- -- 68 -- 98 % --  05/14/20 1703 (!) 175/92 -- -- 70 -- -- --  05/14/20 1645 (!) 177/90 -- -- 61 -- -- --  05/14/20 1616 (!) 168/91 -- -- 64 -- -- --  05/14/20 1600 (!) 162/92 -- -- 68 -- -- --  05/14/20 1558 (!) 161/91 -- -- 71 -- -- --  05/14/20 1548 (!) 170/93 -- -- 66 -- -- --  05/14/20 1546 (!) 170/98 98.8 F (37.1 C) Oral 69 18 98 % --  05/14/20 1155 119/69 98.7 F (37.1 C) Oral 90 18 98 % --  05/14/20 0744 (!) 158/85 98.4 F (36.9 C) Oral 65 18 97 % --  05/14/20 0438 (!) 145/69 98.6 F (37 C) Oral 65 18 98 % --  05/14/20 0431 -- -- -- -- -- -- 76.8 kg  05/14/20 0358 (!) 164/84 98.6 F (37 C) Oral 67 17 97 % --  05/13/20 2328 (!) 158/86 98 F (36.7 C) Oral 71 17 99 % --  05/13/20 2003 135/84 97.7 F (36.5 C) Oral 82 18 99 % --   A&O x 3, no acute distress. Pleasant  Meds- Labetalol 400mg  TID, Hydralazine 50 mg QID and will add now Procardia 30mg  XL  Continue Hydroxyzine for anxiety every 8hrs scheduled  Needs to remain inpatient due to severe range BPs needing medication titration and IV antiHTN meds  V.Cederick Broadnax MD

## 2020-05-15 DIAGNOSIS — R32 Unspecified urinary incontinence: Secondary | ICD-10-CM | POA: Diagnosis not present

## 2020-05-15 DIAGNOSIS — O9089 Other complications of the puerperium, not elsewhere classified: Secondary | ICD-10-CM | POA: Diagnosis not present

## 2020-05-15 DIAGNOSIS — O1415 Severe pre-eclampsia, complicating the puerperium: Secondary | ICD-10-CM | POA: Diagnosis not present

## 2020-05-15 MED ORDER — ACETAMINOPHEN 500 MG PO TABS: 1000.0000 mg | ORAL_TABLET | Freq: Four times a day (QID) | ORAL | 0 refills | Status: AC | PRN

## 2020-05-15 MED ORDER — NIFEDIPINE ER 30 MG PO TB24: 30.0000 mg | ORAL_TABLET | Freq: Every day | ORAL | 0 refills | Status: AC

## 2020-05-15 MED ORDER — HYDRALAZINE HCL 50 MG PO TABS: 50.0000 mg | ORAL_TABLET | Freq: Three times a day (TID) | ORAL | 1 refills | Status: AC

## 2020-05-15 MED ORDER — HYDROXYZINE HCL 25 MG PO TABS: 25.0000 mg | ORAL_TABLET | Freq: Three times a day (TID) | ORAL | 0 refills | Status: AC

## 2020-05-15 MED ORDER — LABETALOL HCL 200 MG PO TABS: 200.0000 mg | ORAL_TABLET | Freq: Three times a day (TID) | ORAL | 0 refills | Status: AC

## 2020-05-15 NOTE — Progress Notes (Signed)
Teaching complete  Out with AUNT

## 2020-05-15 NOTE — Progress Notes (Signed)
Subjective: Feels very well. Is aware when BP up gets HA but not other symptoms. Denies HA since last afternoon when we needed to push IV Labetalol. Procardia 30mg  XL was added last evening. Tolerating well. Low BPs this morning, so Hydralazine and Labetalol dose were held.  No vision changes/ RUQ pain/ LE swelling/ CP/ SOB/ HA now. 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)  Objective: Vitals:   05/15/20 0604 05/15/20 0849 05/15/20 1006 05/15/20 1102  BP: 113/65 127/76 (!) 141/87 122/80  Pulse: 88 97 (!) 105 93  Resp:  18    Temp:  98.1 F (36.7 C)    TempSrc:  Oral    SpO2:  97%  98%  Weight:      Height:       BP: 106-177 / 57-98   General: Well-appearing laying in bed, no distress Cardiovascular: Regular rate and rhythm Lungs clear to auscultation bilaterally Abdomen: Soft, fundus below the umbilicus, no right upper quadrant pain GU: Deferred Lower extremity: No edema, 1+ DTR, no clonus  Labs- no recent labs  CBC CBC Latest Ref Rng & Units 05/12/2020 05/11/2020 05/10/2020  WBC 4.0 - 10.5 K/uL 6.3 8.5 8.3  Hemoglobin 12.0 - 15.0 g/dL 11.1(L) 11.0(L) 10.5(L)  Hematocrit 36 - 46 % 34.8(L) 33.7(L) 32.9(L)  Platelets 150 - 400 K/uL 226 207 211   CMP  CMP Latest Ref Rng & Units 05/12/2020 05/11/2020 05/10/2020  Glucose 70 - 99 mg/dL 99 05/12/2020) 95  BUN 6 - 20 mg/dL 5(L) 6 10  Creatinine 790(W - 1.00 mg/dL 4.09 7.35 3.29  Sodium 135 - 145 mmol/L 141 142 140  Potassium 3.5 - 5.1 mmol/L 3.1(L) 3.3(L) 3.6  Chloride 98 - 111 mmol/L 107 108 109  CO2 22 - 32 mmol/L 24 24 22   Calcium 8.9 - 10.3 mg/dL 7.7(L) 7.1(L) 8.7(L)  Total Protein 6.5 - 8.1 g/dL 9.24) ) 5.6(L)  Total Bilirubin 0.3 - 1.2 mg/dL 2.6(S) 0.3 0.3  Alkaline Phos 38 - 126 U/L 86 88 88  AST 15 - 41 U/L 21 28 38  ALT 0 - 44 U/L 19 20 22     Assessment and plan:  Postpartum preeclampsia with significantly elevated blood pressures needing additional IV push meds  several times this admission, last was at around 2 pm yesterday. Now BPs well controlled and in fact some are low.  Current regimen Procardia 30mg  XL daily, Labetalol 400mg  TID, Hydralazine 50mg  QID. -- will change to Procardia 30XL daily, Labetalol 200mg  TID and Hydralazine 50 mg TID to have better compliance at home  Also Anxiety component- added Vistaril TID   Baby remained at home and was planning to have her come today if she didn't get discharge. But okay for discharge with short term f/up in office on 6/21 or 6/22 with Dr for BP and med titration. Home BPs TID and report if >150/90 or HA / vision changes/ SOB/CP etc PP care discussed.   05/15/2020 12:06 PM

## 2020-05-15 NOTE — Discharge Summary (Signed)
Postpartum Discharge Summary  Date of Service updated 05/15/2020    Patient Name: Patricia Everett DOB: 08-22-1986 MRN: [id number]  Date of admission: 6/13/2021p presented to [facility] for problem with indwelling catheter for pp voiding dysfunction but noted have severe range BPs Delivery date:05/06/2020  Delivering provider: Tiana Loft E  Date of discharge: 05/15/2020  Admitting diagnosis: Preeclampsia in postpartum period [O14.95], PPD #4 Additional problems: Urinary incontinence postpartum at delivery admission (delivered 05/06/20), sent home with indwelling catheter     Discharge diagnosis: Postpartum Preeclampsia with severe range BPs, improved HTN with 3 meds- Procardia 30mg  XL, Hydralazine 50mg  TID, Labetalol 200mg  TID                                            Urinary incontinence resolved. MRI neg.                                       Bilateral Pleural effusions noted on MRI thorax- S/p one dose of Lasix. Nl O2 sats and asymptomatic, nl lung exam                                            Hospital course:  34 y.o. G3P3003 postpartum from a vaginal delivery on 6/9 who presents for evaluation of her indwelling catheter. She states she feels like it is bleeding from the catheter and not her vagina. She has intermittent pain at her urethra. Denies HA or CP or SOB upon presentation. Reported nl BPs at home. On arrival to [facility], she was found to have severe range BPs.  Denies RUQ pain. She states she had some elevated BPs in the office prior to delivery and immediately PP but her labs were normal. Patient needed escalating doses of Labetalol upto 400mg  TID, Hydralazine was added at 50mg  QID and finally Procardia 30mg  XL was added on 6/17 and together it controlled BP well with some low BPs in 110s/ 60s range, leading to holding a dose of Labetalol and Hydralazine on 6/17 night.  Vistaril prn was added for anxiety, baby was at home and she would get anxious before BP checks, it helped once we  switched to TID scheduled dosing.  Her voiding improved spontaneously over hospital course and was discharged without indwelling catheter.   Magnesium Sulfate received: Yes: Seizure prophylaxis at admission for 24 hrs   MRI to r/o acute spinal cord injury due to unexplained urinary incontinence and Neurology consult done Dr Rory Percy 05/12/20 "nml MRI thoracic and lumpar spine; trace bilat pleural effusion, slightly larger left A/P:  Pp severe pre-e; urinary incontinence 1. Severe pre-e: bps severe range last night so increased labetalol dosing and restarted magnesium sulfate to complete 24 hr; labs repeated and still wnl; has had good UOP; plan to monitor bps today and plan d/c home tomorrow pending no changes with urinary incontinence/neuro workup; small pl effusion bilat, plan lasix x1 - pt asymptomatic 2. Urinary incontinence - pt improving and has more sensation over last 24 hrs; able to hold urine for an hour with minimal leakage; now able to stop stream; nml thoracic/lumbar spine mri but pt didn't tolerate for pelvic mri - encouraged pt to  complete this workup today; neurology following, possible compression of lumbosacral plexus; contin q hr voiding, anticipate continued improvement; wait for neuro assessment/plan pending mri findings; pt does agree to proceed with pelvic mri today"   Physical exam on 05/15/20 Vitals:   05/15/20 0604 05/15/20 0849 05/15/20 1006 05/15/20 1102  BP: 113/65 127/76 (!) 141/87 122/80  Pulse: 88 97 (!) 105 93  Resp:  18    Temp:  98.1 F (36.7 C)    TempSrc:  Oral    SpO2:  97%  98%  Weight:      Height:       General: Well-appearing laying in bed, no distress Cardiovascular: Regular rate and rhythm Lungs clear to auscultation bilaterally Abdomen: Soft, fundus below the umbilicus, no right upper quadrant pain GU: Deferred Lower extremity: No edema, 1+ DTR, no clonus  CBC Latest Ref Rng & Units 05/12/2020 05/11/2020 05/10/2020  WBC 4.0 - 10.5 K/uL 6.3 8.5 8.3   Hemoglobin 12.0 - 15.0 g/dL 11.1(L) 11.0(L) 10.5(L)  Hematocrit 36 - 46 % 34.8(L) 33.7(L) 32.9(L)  Platelets 150 - 400 K/uL 226 207 211   CMP Latest Ref Rng & Units 05/12/2020 05/11/2020 05/10/2020  Glucose 70 - 99 mg/dL 99 485(I) 95  BUN 6 - 20 mg/dL 5(L) 6 10  Creatinine 6.27 - 1.00 mg/dL 0.35 0.09 3.81  Sodium 135 - 145 mmol/L 141 142 140  Potassium 3.5 - 5.1 mmol/L 3.1(L) 3.3(L) 3.6  Chloride 98 - 111 mmol/L 107 108 109  CO2 22 - 32 mmol/L 24 24 22   Calcium 8.9 - 10.3 mg/dL 7.7(L) 7.1(L) 8.7(L)  Total Protein 6.5 - 8.1 g/dL ) 8.2(X) 5.6(L)  Total Bilirubin 0.3 - 1.2 mg/dL 9.3(Z) 0.3 0.3  Alkaline Phos 38 - 126 U/L 86 88 88  AST 15 - 41 U/L 21 28 38  ALT 0 - 44 U/L 19 20 22     After visit meds:  Allergies as of 05/15/2020      Reactions   Aspirin    Patient unsure; mother and sister have anaphylaxis reaction Patient is able to take ibuprofen without problems per CNM (05/07/20)      Medication List    STOP taking these medications   cephALEXin 250 MG capsule Commonly known as: KEFLEX     TAKE these medications   acetaminophen 500 MG tablet Commonly known as: TYLENOL Take 2 tablets (1,000 mg total) by mouth every 6 (six) hours as needed for headache.   cetirizine 10 MG tablet Commonly known as: ZYRTEC Take 10 mg by mouth daily.   Diclofenac Potassium(Migraine) 50 MG Pack Commonly known as: Cambia Take 50 mg by mouth as needed (for migraine; max 1 packet per day).   hydrALAZINE 50 MG tablet Commonly known as: APRESOLINE Take 1 tablet (50 mg total) by mouth 3 (three) times daily.   hydrOXYzine 25 MG tablet Commonly known as: ATARAX/VISTARIL Take 1 tablet (25 mg total) by mouth every 8 (eight) hours.   ibuprofen 600 MG tablet Commonly known as: ADVIL Take 1 tablet (600 mg total) by mouth every 6 (six) hours as needed.   iron polysaccharides 150 MG capsule Commonly known as: NIFEREX Take 1 capsule (150 mg total) by mouth daily.   labetalol 200 MG  tablet Commonly known as: NORMODYNE Take 1 tablet (200 mg total) by mouth 3 (three) times daily.   NIFEdipine 30 MG 24 hr tablet Commonly known as: ADALAT CC Take 1 tablet (30 mg total) by mouth daily. Start taking on: May 16, 2020   omeprazole 20 MG capsule Commonly known as: PRILOSEC Take 20 mg by mouth daily.   rizatriptan 10 MG disintegrating tablet Commonly known as: MAXALT-MLT Take 1 tablet (10 mg total) by mouth as needed for migraine. May repeat in 2 hours if needed        Discharge home in stable condition Discharge instruction: warning s/s of PEC discussed and will start monitoring BP at home (mother worked in Cardiology office and can help, pt will stay with her mother). Activity: Advance as tolerated. Pelvic rest for 6 weeks.  Diet: low salt diet Future Appointments:Follow up Visit:  Follow-up Information    Vick Frees, MD Follow up on 05/18/2020.   Specialty: Obstetrics and Gynecology Why: on 6/21 or 6/22, office wiill call you Contact information: [address] [place] [address] [phone]              05/15/2020 Robley Fries, MD

## 2020-05-18 DIAGNOSIS — O135 Gestational [pregnancy-induced] hypertension without significant proteinuria, complicating the puerperium: Secondary | ICD-10-CM | POA: Diagnosis not present

## 2020-05-26 DIAGNOSIS — O1415 Severe pre-eclampsia, complicating the puerperium: Secondary | ICD-10-CM | POA: Diagnosis not present

## 2020-09-08 IMAGING — MR MR LUMBAR SPINE W/O CM
5 series · 31 of 48 positions shown · non-contrast
Comparison: None available.

CLINICAL DATA: Initial evaluation for new onset urinary
incontinence, mild lower back pain. Patient 5 days postpartum.
Received epidural with labor and delivery.

EXAM:
MRI THORACIC AND LUMBAR SPINE WITHOUT CONTRAST
TECHNIQUE: Multiplanar and multiecho pulse sequences of the thoracic and lumbar
spine were obtained without intravenous contrast.

[Series 5: T2 · sagittal · 4.0mm · 1.09mm/px · 6 of 16 slices shown (1 of 2)]
[im 1/16]
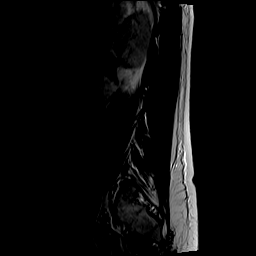
[im 4/16]
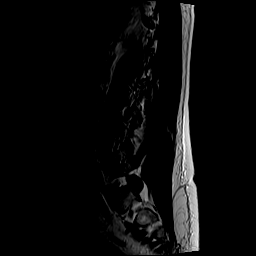
[im 7/16]
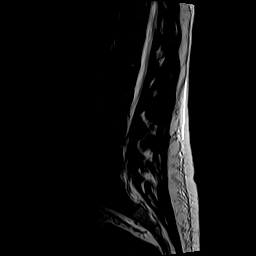
[im 10/16]
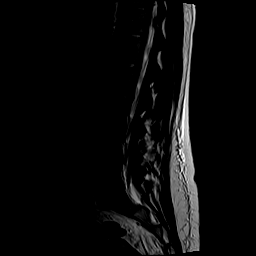
[im 13/16]
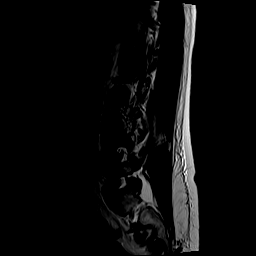
[im 16/16]
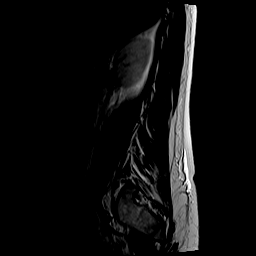

[Series 6: STIR · sagittal · 4.0mm · 0.55mm/px · 1 of 16 slices shown]
[im 1/16]
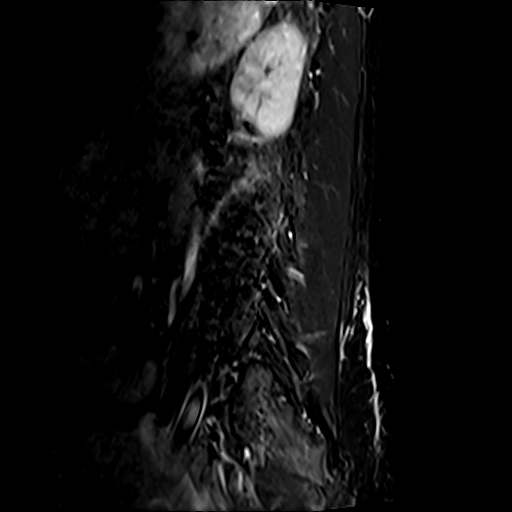

[Series 7: T1 · sagittal · 4.0mm · 0.88mm/px · 6 of 16 slices shown (1 of 2)]
[im 1/16]
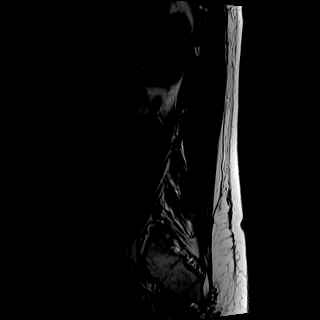
[im 4/16]
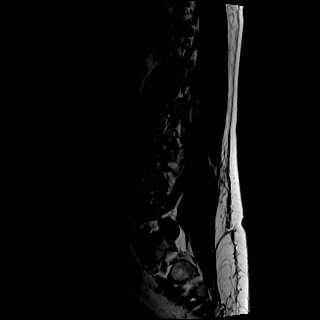
[im 7/16]
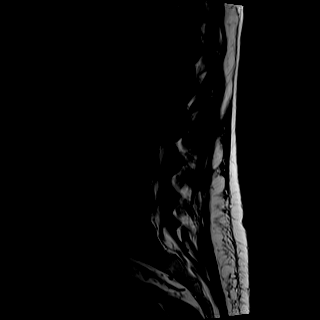
[im 10/16]
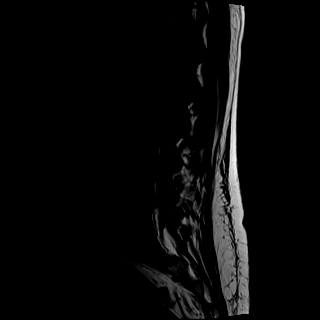
[im 13/16]
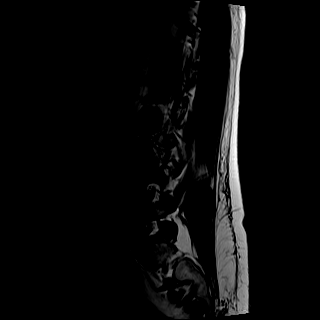
[im 16/16]
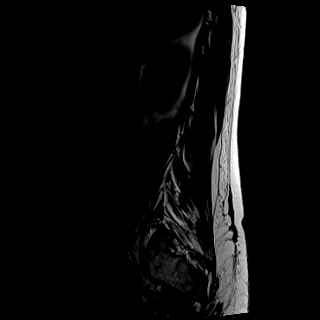

[Series 8: T2 · axial · 4.0mm · 0.86mm/px · z∈[-127,+89]mm · 9 of 38 slices shown (2 of 2)]
[im 1/38]
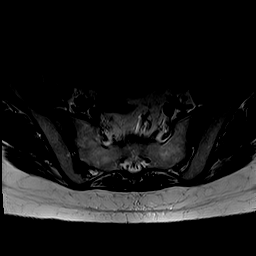
[im 6/38]
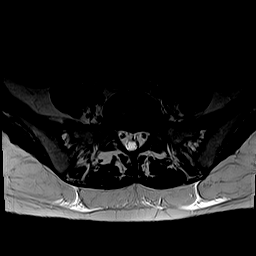
[im 11/38]
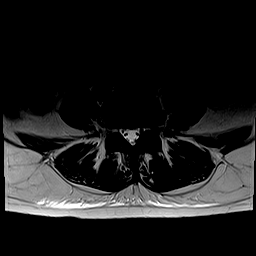
[im 16/38]
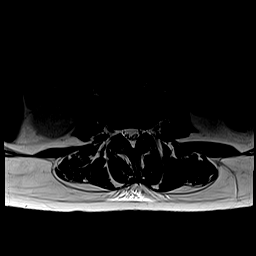
[im 19/38]
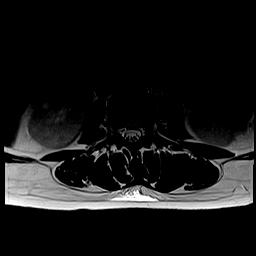
[im 22/38]
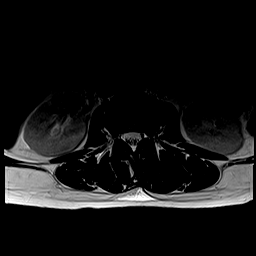
[im 27/38]
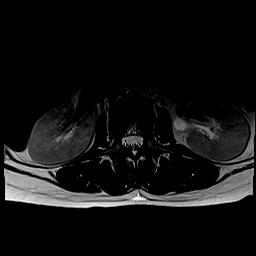
[im 32/38]
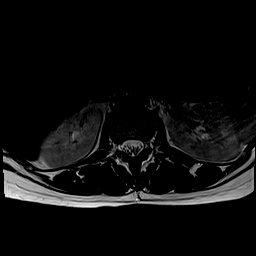
[im 38/38]
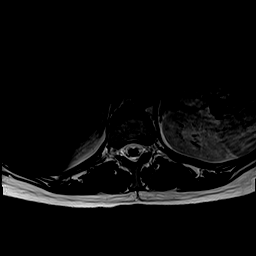

[Series 9: T1 · axial · 4.0mm · 0.43mm/px · z∈[-127,+89]mm · 9 of 38 slices shown (2 of 2)]
[im 1/38]
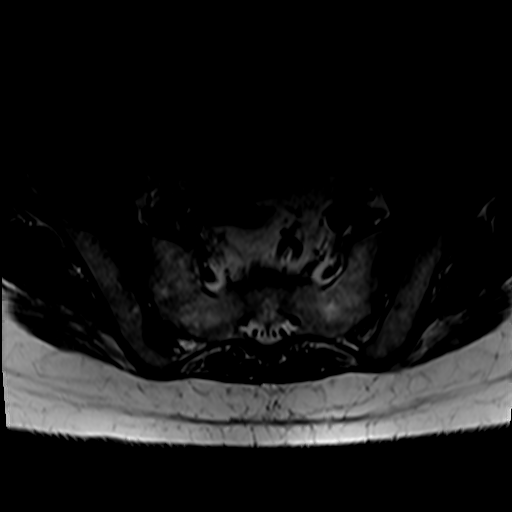
[im 6/38]
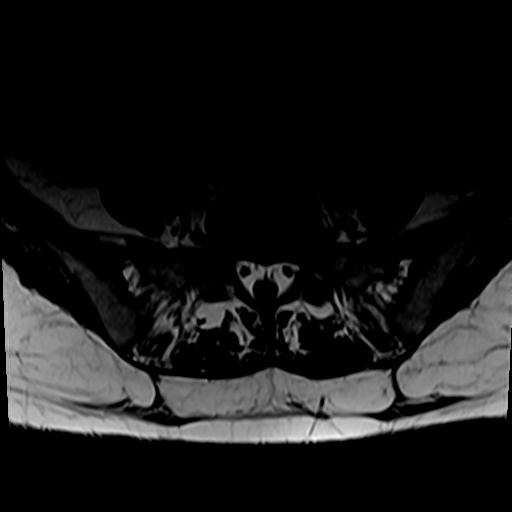
[im 11/38]
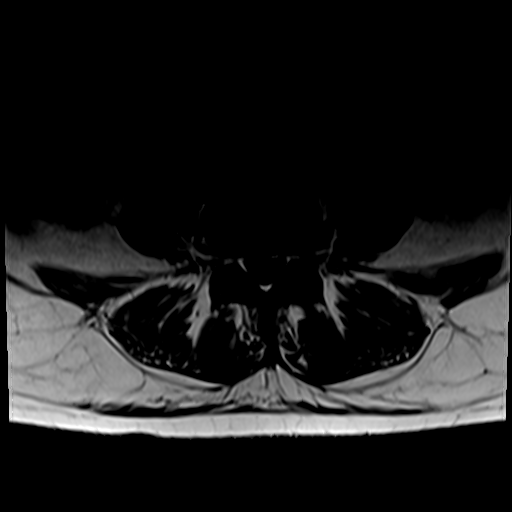
[im 16/38]
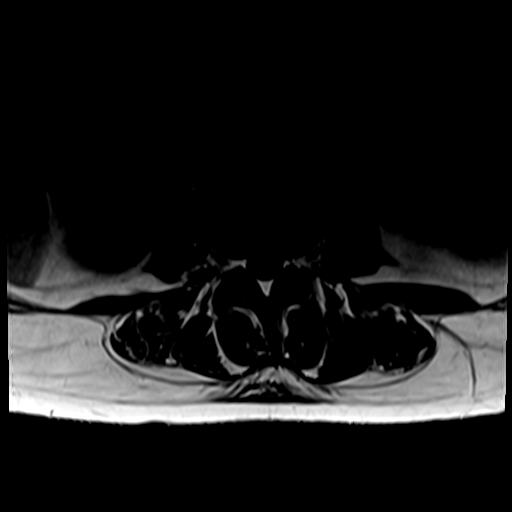
[im 19/38]
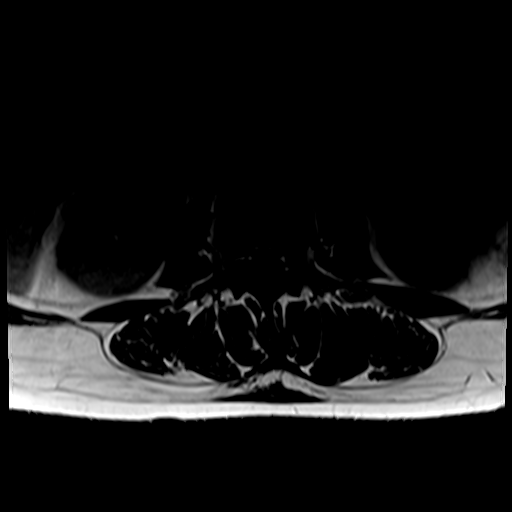
[im 22/38]
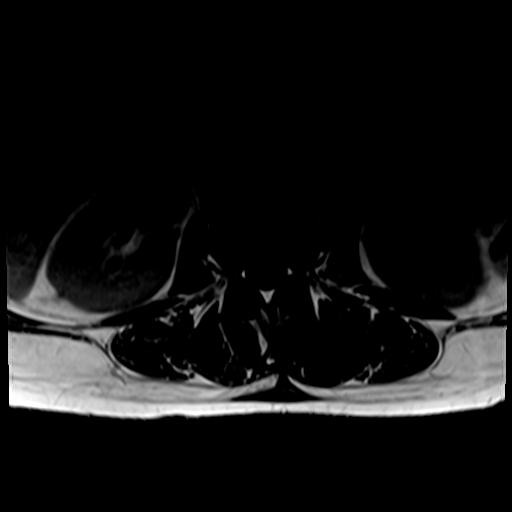
[im 27/38]
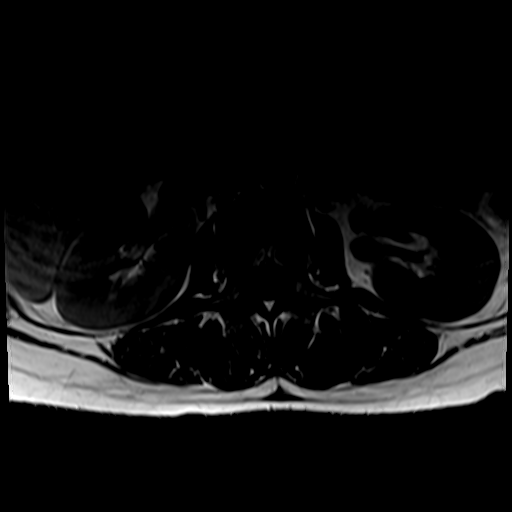
[im 32/38]
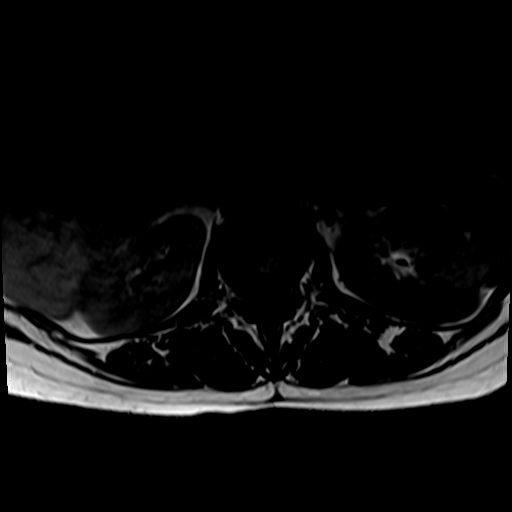
[im 38/38]
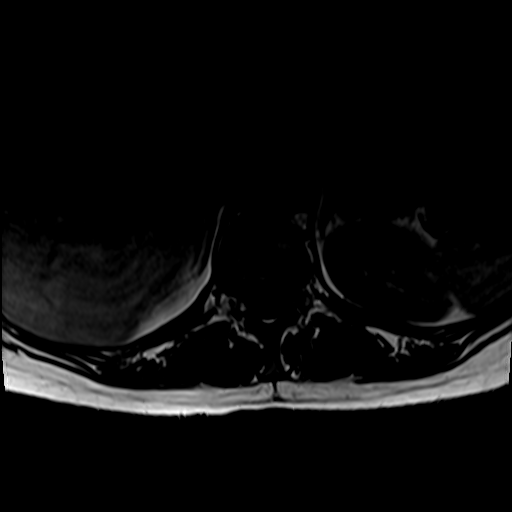

[31 of 48 positions shown; findings below may reference images not displayed]

FINDINGS: MRI THORACIC SPINE FINDINGS

Alignment: Vertebral bodies normally aligned with preservation of
the normal thoracic kyphosis. No listhesis or subluxation.

Vertebrae: Vertebral body height well maintained without evidence
for acute or chronic fracture. Bone marrow signal intensity within
normal limits. No discrete or worrisome osseous lesions. No abnormal
marrow edema.

Cord: Signal intensity within the thoracic spinal cord is normal.
Normal cord caliber and morphology. No abnormal epidural collections
or other complication status post recent epidural injection.

Paraspinal and other soft tissues: Paraspinous soft tissues within
normal limits. Trace layering bilateral pleural effusions, slightly
larger on the left. Visualized lungs are otherwise grossly clear.
Visualized visceral structures otherwise unremarkable.

Disc levels:

No significant disc pathology seen within the thoracic spine.
Intervertebral discs are well hydrated with preserved disc height.
No disc bulge or focal disc herniation. No significant facet
disease. No canal or neural foraminal stenosis. No impingement.

MRI LUMBAR SPINE FINDINGS

Segmentation: Standard. Lowest well-formed disc space labeled the
L5-S1 level.

Alignment: Physiologic with preservation of the normal lumbar
lordosis. No listhesis or subluxation.

Vertebrae: Vertebral body height well maintained without evidence
for acute or chronic fracture. Bone marrow signal intensity within
normal limits. No discrete or worrisome osseous lesions. No abnormal
marrow edema.

Conus medullaris: Extends to the L1 level and appears normal. No
epidural collection or other complication related to recent epidural
spinal injection is seen.

Paraspinal and other soft tissues: Paraspinous soft tissues within
normal limits. Subcentimeter simple cyst noted within the interpolar
right kidney. Visualized uterus is enlarged and globular in
appearance, consistent with history of recent labor and delivery.
Visualized visceral structures otherwise unremarkable.

Disc levels:

No significant disc pathology seen within the lumbar spine.
Intervertebral discs are well hydrated with preserved disc height.
No disc bulge or focal disc herniation. No significant facet
disease. No canal or neural foraminal stenosis. No neural
impingement.
IMPRESSION: 1. Normal MRI of the thoracic and lumbar spine. No abnormal epidural
collection or other complication status post recent epidural spinal
injection.
2. Trace layering bilateral pleural effusions, slightly larger on
the left.
